# Patient Record
Sex: Male | Born: 1975 | Race: White | Hispanic: No | Marital: Married | Smoking: Current every day smoker
Health system: Southern US, Community
[De-identification: ages and names within clinical notes are randomized; demographics above are authoritative.]

## PROBLEM LIST (undated history)

## (undated) DIAGNOSIS — I38 Endocarditis, valve unspecified: Secondary | ICD-10-CM

## (undated) HISTORY — PX: HERNIA REPAIR: SHX51

---

## 2005-04-19 ENCOUNTER — Emergency Department: Payer: Self-pay | Admitting: Emergency Medicine

## 2007-01-25 ENCOUNTER — Emergency Department: Payer: Self-pay | Admitting: Emergency Medicine

## 2007-01-26 ENCOUNTER — Emergency Department: Payer: Self-pay | Admitting: Emergency Medicine

## 2007-05-23 ENCOUNTER — Emergency Department: Payer: Self-pay | Admitting: Emergency Medicine

## 2008-08-19 IMAGING — CR DG CHEST 2V
1 series · 2 of 2 positions shown · non-contrast
Comparison: none

REASON FOR EXAM: Cough
COMMENTS:   LMP: (Male)

PROCEDURE:     DXR - DXR CHEST PA (OR AP) AND LATERAL  - May 23, 2007  [DATE]
RESULT:     The lungs are clear. The cardiac silhouette and visualized bony
skeleton are unremarkable.

[Series 1: view not recorded · 0.17mm/px · 2 of 2 slices shown]
[im 1/2]
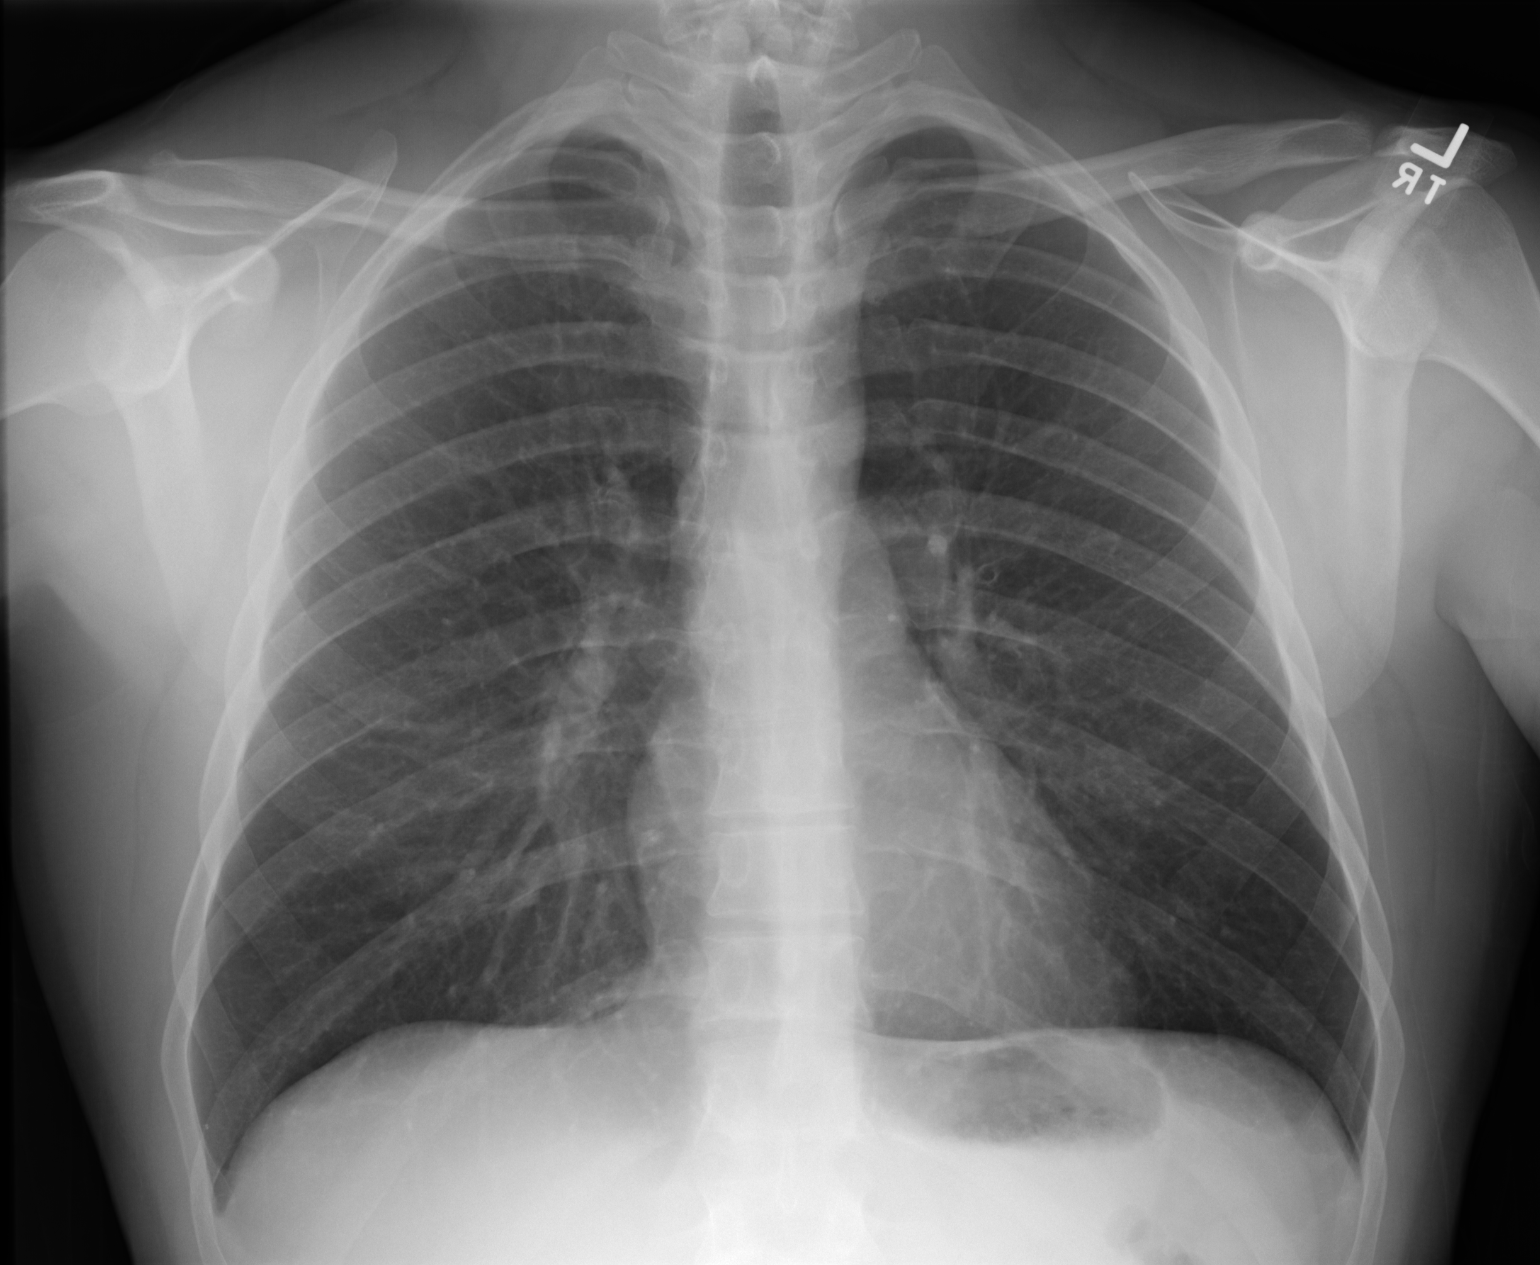
[im 2/2]
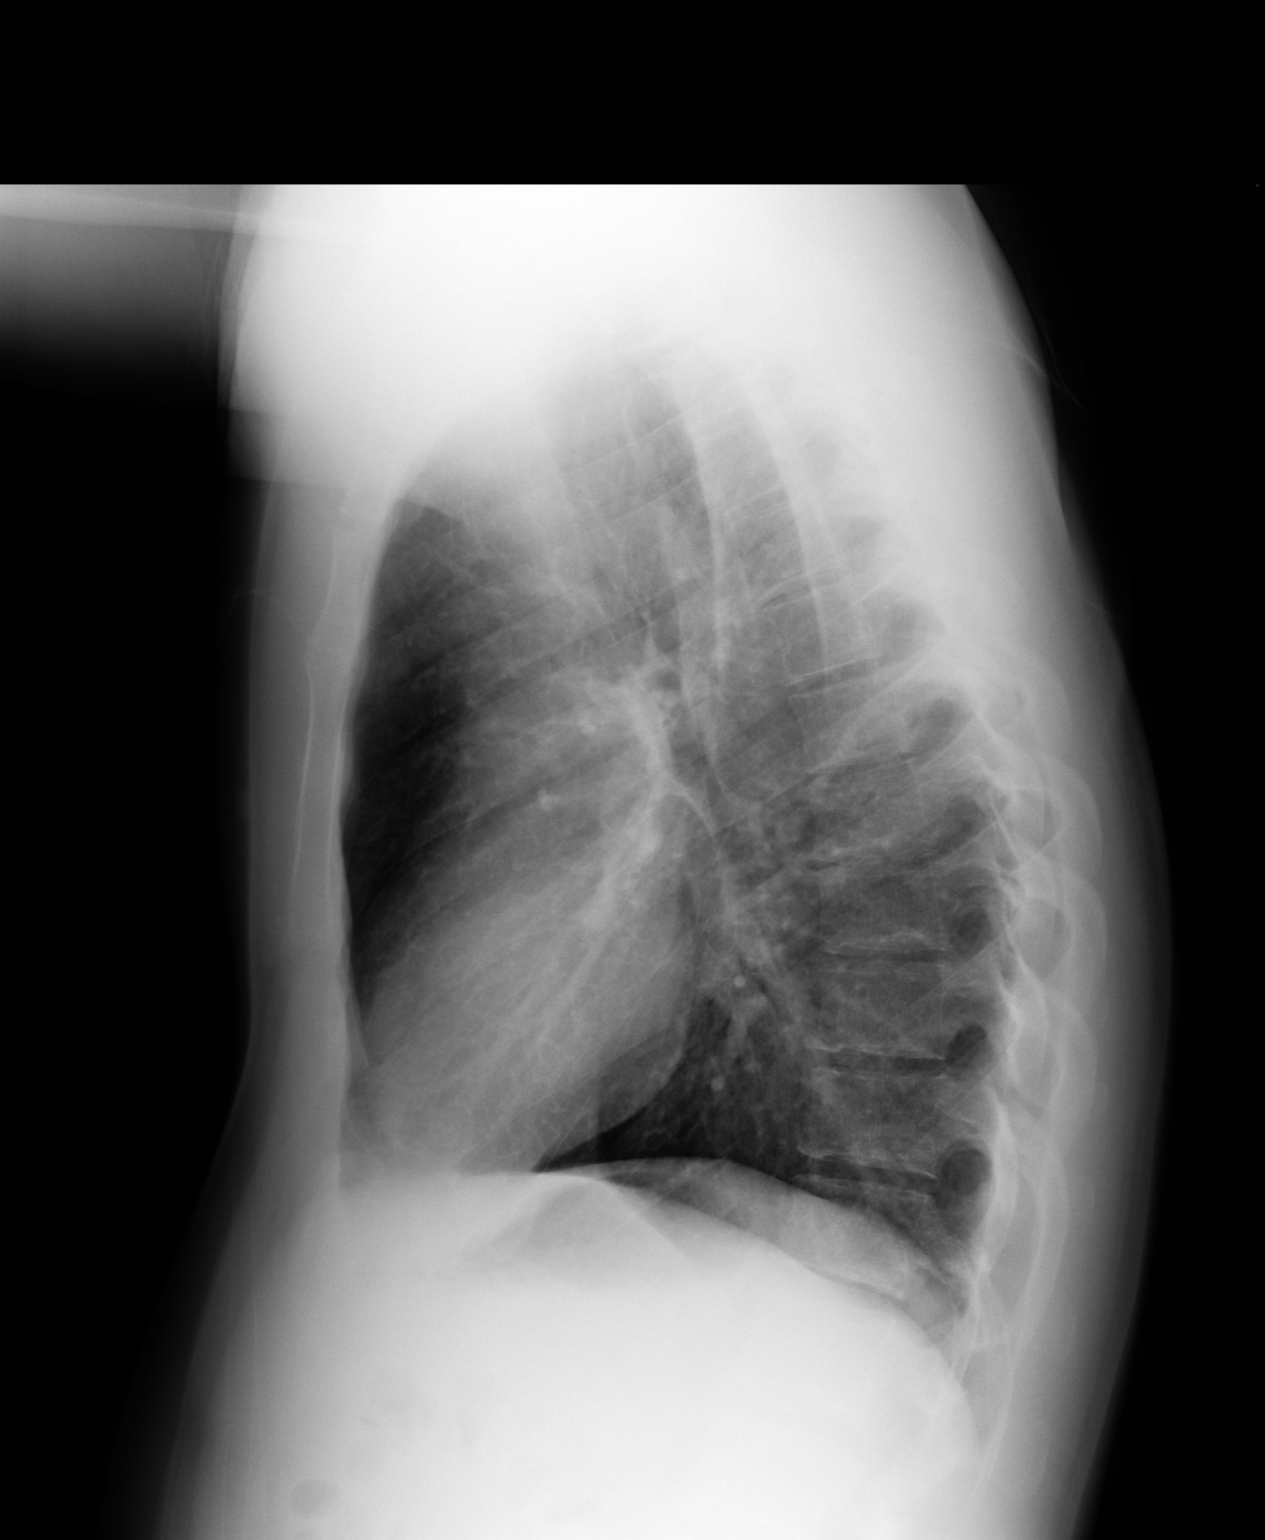

[2 of 2 positions shown; findings below may reference images not displayed]

IMPRESSION: Chest radiograph without evidence of acute cardiopulmonary
disease.

## 2008-12-30 ENCOUNTER — Emergency Department: Payer: Self-pay | Admitting: Emergency Medicine

## 2010-03-10 ENCOUNTER — Emergency Department: Payer: Self-pay | Admitting: Emergency Medicine

## 2012-02-24 ENCOUNTER — Ambulatory Visit: Payer: Self-pay

## 2012-07-11 ENCOUNTER — Emergency Department: Payer: Self-pay | Admitting: Emergency Medicine

## 2012-07-11 LAB — COMPREHENSIVE METABOLIC PANEL
Anion Gap: 4 — ABNORMAL LOW (ref 7–16)
BUN: 12 mg/dL (ref 7–18)
Bilirubin,Total: 0.7 mg/dL (ref 0.2–1.0)
Calcium, Total: 9 mg/dL (ref 8.5–10.1)
Chloride: 109 mmol/L — ABNORMAL HIGH (ref 98–107)
Co2: 26 mmol/L (ref 21–32)
Glucose: 98 mg/dL (ref 65–99)
Osmolality: 277 (ref 275–301)
Potassium: 4.1 mmol/L (ref 3.5–5.1)
SGOT(AST): 27 U/L (ref 15–37)
Total Protein: 7.7 g/dL (ref 6.4–8.2)

## 2012-07-11 LAB — LIPASE, BLOOD: Lipase: 66 U/L — ABNORMAL LOW (ref 73–393)

## 2012-07-11 LAB — CBC
HCT: 46.2 % (ref 40.0–52.0)
HGB: 15.7 g/dL (ref 13.0–18.0)
MCV: 85 fL (ref 80–100)
RDW: 12.8 % (ref 11.5–14.5)
WBC: 7 10*3/uL (ref 3.8–10.6)

## 2012-08-22 ENCOUNTER — Emergency Department: Payer: Self-pay | Admitting: Emergency Medicine

## 2015-10-01 ENCOUNTER — Emergency Department: Payer: Self-pay

## 2015-10-01 ENCOUNTER — Encounter: Payer: Self-pay | Admitting: Emergency Medicine

## 2015-10-01 DIAGNOSIS — F17200 Nicotine dependence, unspecified, uncomplicated: Secondary | ICD-10-CM | POA: Insufficient documentation

## 2015-10-01 DIAGNOSIS — R1013 Epigastric pain: Secondary | ICD-10-CM | POA: Insufficient documentation

## 2015-10-01 DIAGNOSIS — R109 Unspecified abdominal pain: Secondary | ICD-10-CM | POA: Insufficient documentation

## 2015-10-01 LAB — BASIC METABOLIC PANEL
ANION GAP: 7 (ref 5–15)
BUN: 10 mg/dL (ref 6–20)
CO2: 29 mmol/L (ref 22–32)
Calcium: 9.6 mg/dL (ref 8.9–10.3)
Chloride: 104 mmol/L (ref 101–111)
Creatinine, Ser: 0.9 mg/dL (ref 0.61–1.24)
GFR calc Af Amer: >60 mL/min (ref >60)
GFR calc non Af Amer: >60 mL/min (ref >60)
GLUCOSE: 88 mg/dL (ref 65–99)
POTASSIUM: 3.8 mmol/L (ref 3.5–5.1)
Sodium: 140 mmol/L (ref 135–145)

## 2015-10-01 LAB — CBC
HEMATOCRIT: 43 % (ref 40.0–52.0)
HEMOGLOBIN: 15 g/dL (ref 13.0–18.0)
MCH: 29.2 pg (ref 26.0–34.0)
MCHC: 34.8 g/dL (ref 32.0–36.0)
MCV: 84 fL (ref 80.0–100.0)
Platelets: 235 10*3/uL (ref 150–440)
RBC: 5.12 MIL/uL (ref 4.40–5.90)
RDW: 12.8 % (ref 11.5–14.5)
WBC: 10.9 10*3/uL — ABNORMAL HIGH (ref 3.8–10.6)

## 2015-10-01 LAB — TROPONIN I: Troponin I: <0.03 ng/mL (ref <0.031)

## 2015-10-01 NOTE — ED Notes (Signed)
PT to ER states chest pain for last 4-5 weeks intermittent.  States worse when driving or sitting up.  Pt points to just below epigastric region, states he feels a knot.  States got worse tonight when he picked up a water heater.  States mild SHOB, denies other symptoms.

## 2015-10-02 ENCOUNTER — Emergency Department
Admission: EM | Admit: 2015-10-02 | Discharge: 2015-10-02 | Disposition: A | Discharge status: Home / Self Care | Payer: Self-pay | Attending: Emergency Medicine | Admitting: Emergency Medicine

## 2015-10-02 ENCOUNTER — Emergency Department: Payer: Self-pay

## 2015-10-02 DIAGNOSIS — M7918 Myalgia, other site: Secondary | ICD-10-CM

## 2015-10-02 MED ORDER — MORPHINE SULFATE (PF) 2 MG/ML IV SOLN
2.0000 mg | Freq: Once | INTRAVENOUS | Status: DC
  Filled 2015-10-02: qty 1

## 2015-10-02 MED ORDER — CYCLOBENZAPRINE HCL 10 MG PO TABS: 10.0000 mg | ORAL_TABLET | Freq: Three times a day (TID) | ORAL | Status: AC | PRN

## 2015-10-02 MED ORDER — IOHEXOL 240 MG/ML SOLN
25.0000 mL | Freq: Once | INTRAMUSCULAR | Status: AC | PRN
  Administered 2015-10-02: 25 mL via ORAL

## 2015-10-02 MED ORDER — ONDANSETRON HCL 4 MG/2ML IJ SOLN
4.0000 mg | Freq: Once | INTRAMUSCULAR | Status: DC
  Filled 2015-10-02: qty 2

## 2015-10-02 MED ORDER — IOHEXOL 300 MG/ML  SOLN
100.0000 mL | Freq: Once | INTRAMUSCULAR | Status: AC | PRN
  Administered 2015-10-02: 100 mL via INTRAVENOUS

## 2015-10-02 NOTE — ED Provider Notes (Signed)
[facility] Emergency Department Provider Note  ____________________________________________  Time seen: 1:45 AM  I have reviewed the triage vital signs and the nursing notes.   HISTORY  Chief Complaint Chest Pain      HPI Danny Downs is a 39 y.o. male presents with epigastric abdominal discomfort 4-5 weeks with a "knot" in the area that is tender to touch. Patient states pain worsened tonight after he picked up a water heater.     Past medical history Umbilical Hernia There are no active problems to display for this patient.   Past Surgical History  Procedure Laterality Date  . Hernia repair      No current outpatient prescriptions on file.  Allergies No Known Drug allergies History reviewed. No pertinent family history.  Social History Social History  Substance Use Topics  . Smoking status: Heavy Tobacco Smoker  . Smokeless tobacco: None  . Alcohol Use: None    Review of Systems  Constitutional: Negative for fever. Eyes: Negative for visual changes. ENT: Negative for sore throat. Cardiovascular: Negative for chest pain. Respiratory: Negative for shortness of breath. Gastrointestinal: Positive for abdominal pain Genitourinary: Negative for dysuria. Musculoskeletal: Negative for back pain. Skin: Negative for rash. Neurological: Negative for headaches, focal weakness or numbness.   10-point ROS otherwise negative.  ____________________________________________   PHYSICAL EXAM:  VITAL SIGNS: ED Triage Vitals  Enc Vitals Group     BP 10/01/15 2038 119/68 mmHg     Pulse Rate 10/01/15 2038 71     Resp 10/01/15 2038 18     Temp 10/01/15 2038 98.1 F (36.7 C)     Temp src --      SpO2 10/01/15 2038 97 %     Weight 10/01/15 2038 137 lb (62.143 kg)     Height 10/01/15 2038 5\' 4"  (1.626 m)     Head Cir --      Peak Flow --      Pain Score 10/01/15 2037 5     Pain Loc --      Pain Edu? --      Excl. in GC? --      Constitutional: Alert and oriented. Well appearing and in no distress. Eyes: Conjunctivae are normal. PERRL. Normal extraocular movements. ENT   Head: Normocephalic and atraumatic.   Nose: No congestion/rhinnorhea.   Mouth/Throat: Mucous membranes are moist.   Neck: No stridor. Hematological/Lymphatic/Immunilogical: No cervical lymphadenopathy. Cardiovascular: Normal rate, regular rhythm. Normal and symmetric distal pulses are present in all extremities. No murmurs, rubs, or gallops. Respiratory: Normal respiratory effort without tachypnea nor retractions. Breath sounds are clear and equal bilaterally. No wheezes/rales/rhonchi. Gastrointestinal: Palpable epigastric 2 cm masslike area is tender to touch. No distention. There is no CVA tenderness. Genitourinary: deferred Musculoskeletal: Nontender with normal range of motion in all extremities. No joint effusions.  No lower extremity tenderness nor edema. Neurologic:  Normal speech and language. No gross focal neurologic deficits are appreciated. Speech is normal.  Skin:  Skin is warm, dry and intact. No rash noted. Psychiatric: Mood and affect are normal. Speech and behavior are normal. Patient exhibits appropriate insight and judgment.  ____________________________________________    LABS (pertinent positives/negatives)  Labs Reviewed  CBC - Abnormal; Notable for the following:    WBC 10.9 (*)    All other components within normal limits  BASIC METABOLIC PANEL  TROPONIN I      RADIOLOGY    DG Chest 2 View (Final result) Result time: 10/01/15 21:48:13  Final result by Rad Results In Interface (10/01/15 21:48:13)   Narrative:   CLINICAL DATA: Chest pain and shortness of breath for several weeks.  EXAM: CHEST 2 VIEW  COMPARISON: 12/30/2008  FINDINGS: The heart size and mediastinal contours are within normal limits. No evidence of pulmonary airspace disease or pleural effusion. Mild hyperinflation  and central peribronchial thickening again noted. The visualized skeletal structures are unremarkable.  IMPRESSION: Stable mild hyperinflation and central peribronchial thickening. No active lung disease.   Electronically Signed By: Myles Rosenthal M.D. On: 10/01/2015 21:48      CT Abdomen Pelvis W Contrast (Final result) Result time: 10/02/15 02:56:08   Final result by Rad Results In Interface (10/02/15 02:56:08)   Narrative:   CLINICAL DATA: Epigastric discomfort. Non reducible palpable mass.  EXAM: CT ABDOMEN AND PELVIS WITH CONTRAST  TECHNIQUE: Multidetector CT imaging of the abdomen and pelvis was performed using the standard protocol following bolus administration of intravenous contrast.  CONTRAST: OMNIPAQUE IOHEXOL 300 MG/ML SOLN  COMPARISON: None.  FINDINGS: Lower chest: The included lung bases are clear.  Liver: No focal hepatic lesion. Normal in size.  Hepatobiliary: Gallbladder physiologically distended, no calcified stone. No biliary dilatation.  Pancreas: Normal. No ductal dilatation, inflammation or pancreatic mass.  Spleen: Normal.  Adrenal glands: No nodule.  Kidneys: Symmetric renal enhancement. No hydronephrosis. No perinephric stranding or localizing renal abnormality.  Stomach/Bowel: Stomach physiologically distended. There are no dilated or thickened small bowel loops. Moderate volume of stool throughout the colon without colonic wall thickening. The appendix is normal.  Vascular/Lymphatic: No retroperitoneal adenopathy. Abdominal aorta is normal in caliber. Mild atherosclerosis of the distal aorta and its branches.  Reproductive: Prostate gland normal in size.  Bladder: Physiologically distended without wall thickening.  Other: Anterior abdominal wall is intact without hernia. No subcutaneous soft tissue abnormality. No intra-abdominal mass. No free air, free fluid, or intra-abdominal fluid  collection.  Musculoskeletal: There are no acute or suspicious osseous abnormalities.  IMPRESSION: No acute abnormality in the abdomen/pelvis. No mass or hernia.   Electronically Signed By: Rubye Oaks M.D. On: 10/02/2015 02:56         INITIAL IMPRESSION / ASSESSMENT AND PLAN / ED COURSE  Pertinent labs & imaging results that were available during my care of the patient were reviewed by me and considered in my medical decision making (see chart for details).    ____________________________________________   FINAL CLINICAL IMPRESSION(S) / ED DIAGNOSES  Final diagnoses:  Musculoskeletal pain      Darci Current, MD 10/03/15 [phone]

## 2015-10-02 NOTE — Discharge Instructions (Signed)
Muscle Pain, Adult  Muscle pain (myalgia) may be caused by many things, including:  · Overuse or muscle strain, especially if you are not in shape. This is the most common cause of muscle pain.  · Injury.  · Bruises.  · Viruses, such as the flu.  · Infectious diseases.  · Fibromyalgia, which is a chronic condition that causes muscle tenderness, fatigue, and headache.  · Autoimmune diseases, including lupus.  · Certain drugs, including ACE inhibitors and statins.  Muscle pain may be mild or severe. In most cases, the pain lasts only a short time and goes away without treatment. To diagnose the cause of your muscle pain, your health care provider will take your medical history. This means he or she will ask you when your muscle pain began and what has been happening. If you have not had muscle pain for very long, your health care provider may want to wait before doing much testing. If your muscle pain has lasted a long time, your health care provider may want to run tests right away. If your health care provider thinks your muscle pain may be caused by illness, you may need to have additional tests to rule out certain conditions.   Treatment for muscle pain depends on the cause. Home care is often enough to relieve muscle pain. Your health care provider may also prescribe anti-inflammatory medicine.  HOME CARE INSTRUCTIONS  Watch your condition for any changes. The following actions may help to lessen any discomfort you are feeling:  · Only take over-the-counter or prescription medicines as directed by your health care provider.  · Apply ice to the sore muscle:    Put ice in a plastic bag.    Place a towel between your skin and the bag.    Leave the ice on for 15-20 minutes, 3-4 times a day.  · You may alternate applying hot and cold packs to the muscle as directed by your health care provider.  · If overuse is causing your muscle pain, slow down your activities until the pain goes away.    Remember that it is normal  to feel some muscle pain after starting a workout program. Muscles that have not been used often will be sore at first.    Do regular, gentle exercises if you are not usually active.    Warm up before exercising to lower your risk of muscle pain.  · Do not continue working out if the pain is very bad. Bad pain could mean you have injured a muscle.  SEEK MEDICAL CARE IF:  · Your muscle pain gets worse, and medicines do not help.  · You have muscle pain that lasts longer than 3 days.  · You have a rash or fever along with muscle pain.  · You have muscle pain after a tick bite.  · You have muscle pain while working out, even though you are in good physical condition.  · You have redness, soreness, or swelling along with muscle pain.  · You have muscle pain after starting a new medicine or changing the dose of a medicine.  SEEK IMMEDIATE MEDICAL CARE IF:  · You have trouble breathing.  · You have trouble swallowing.  · You have muscle pain along with a stiff neck, fever, and vomiting.  · You have severe muscle weakness or cannot move part of your body.  MAKE SURE YOU:   · Understand these instructions.  · Will watch your condition.  · Will get   help right away if you are not doing well or get worse.     This information is not intended to replace advice given to you by your health care provider. Make sure you discuss any questions you have with your health care provider.     Document Released: 09/23/2006 Document Revised: 11/22/2014 Document Reviewed: 08/28/2013  Elsevier Interactive Patient Education ©2016 [employer]

## 2016-10-25 ENCOUNTER — Encounter: Payer: Self-pay | Admitting: Emergency Medicine

## 2016-10-25 ENCOUNTER — Emergency Department: Payer: Self-pay

## 2016-10-25 ENCOUNTER — Emergency Department
Admission: EM | Admit: 2016-10-25 | Discharge: 2016-10-25 | Disposition: A | Discharge status: Home / Self Care | Payer: Self-pay | Attending: Emergency Medicine | Admitting: Emergency Medicine

## 2016-10-25 DIAGNOSIS — F410 Panic disorder [episodic paroxysmal anxiety] without agoraphobia: Secondary | ICD-10-CM | POA: Insufficient documentation

## 2016-10-25 DIAGNOSIS — F17200 Nicotine dependence, unspecified, uncomplicated: Secondary | ICD-10-CM | POA: Insufficient documentation

## 2016-10-25 DIAGNOSIS — R079 Chest pain, unspecified: Secondary | ICD-10-CM

## 2016-10-25 HISTORY — DX: Endocarditis, valve unspecified: I38

## 2016-10-25 LAB — CBC
HCT: 42.1 % (ref 40.0–52.0)
Hemoglobin: 14.6 g/dL (ref 13.0–18.0)
MCH: 29.1 pg (ref 26.0–34.0)
MCHC: 34.7 g/dL (ref 32.0–36.0)
MCV: 83.7 fL (ref 80.0–100.0)
PLATELETS: 279 10*3/uL (ref 150–440)
RBC: 5.02 MIL/uL (ref 4.40–5.90)
RDW: 13 % (ref 11.5–14.5)
WBC: 9.4 10*3/uL (ref 3.8–10.6)

## 2016-10-25 LAB — BASIC METABOLIC PANEL
ANION GAP: 5 (ref 5–15)
BUN: 10 mg/dL (ref 6–20)
CALCIUM: 9.5 mg/dL (ref 8.9–10.3)
CHLORIDE: 107 mmol/L (ref 101–111)
CO2: 27 mmol/L (ref 22–32)
CREATININE: 0.73 mg/dL (ref 0.61–1.24)
GFR calc Af Amer: >60 mL/min (ref >60)
GFR calc non Af Amer: >60 mL/min (ref >60)
GLUCOSE: 103 mg/dL — AB (ref 65–99)
POTASSIUM: 3.8 mmol/L (ref 3.5–5.1)
SODIUM: 139 mmol/L (ref 135–145)

## 2016-10-25 LAB — TROPONIN I

## 2016-10-25 MED ORDER — ESCITALOPRAM OXALATE 10 MG PO TABS: 10.0000 mg | ORAL_TABLET | Freq: Every day | ORAL | 2 refills | Status: DC
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
## 2016-10-25 NOTE — ED Triage Notes (Signed)
Says chest pain since this am.  Thought it was regular panic attack, but did not get better and his chest hurts.

## 2016-12-28 IMAGING — CR DG CHEST 2V
2 series · 2 of 2 positions shown · non-contrast
Comparison: 12/30/2008

CLINICAL DATA: Chest pain and shortness of breath for several
weeks.

EXAM:
CHEST  2 VIEW

[chest pa]
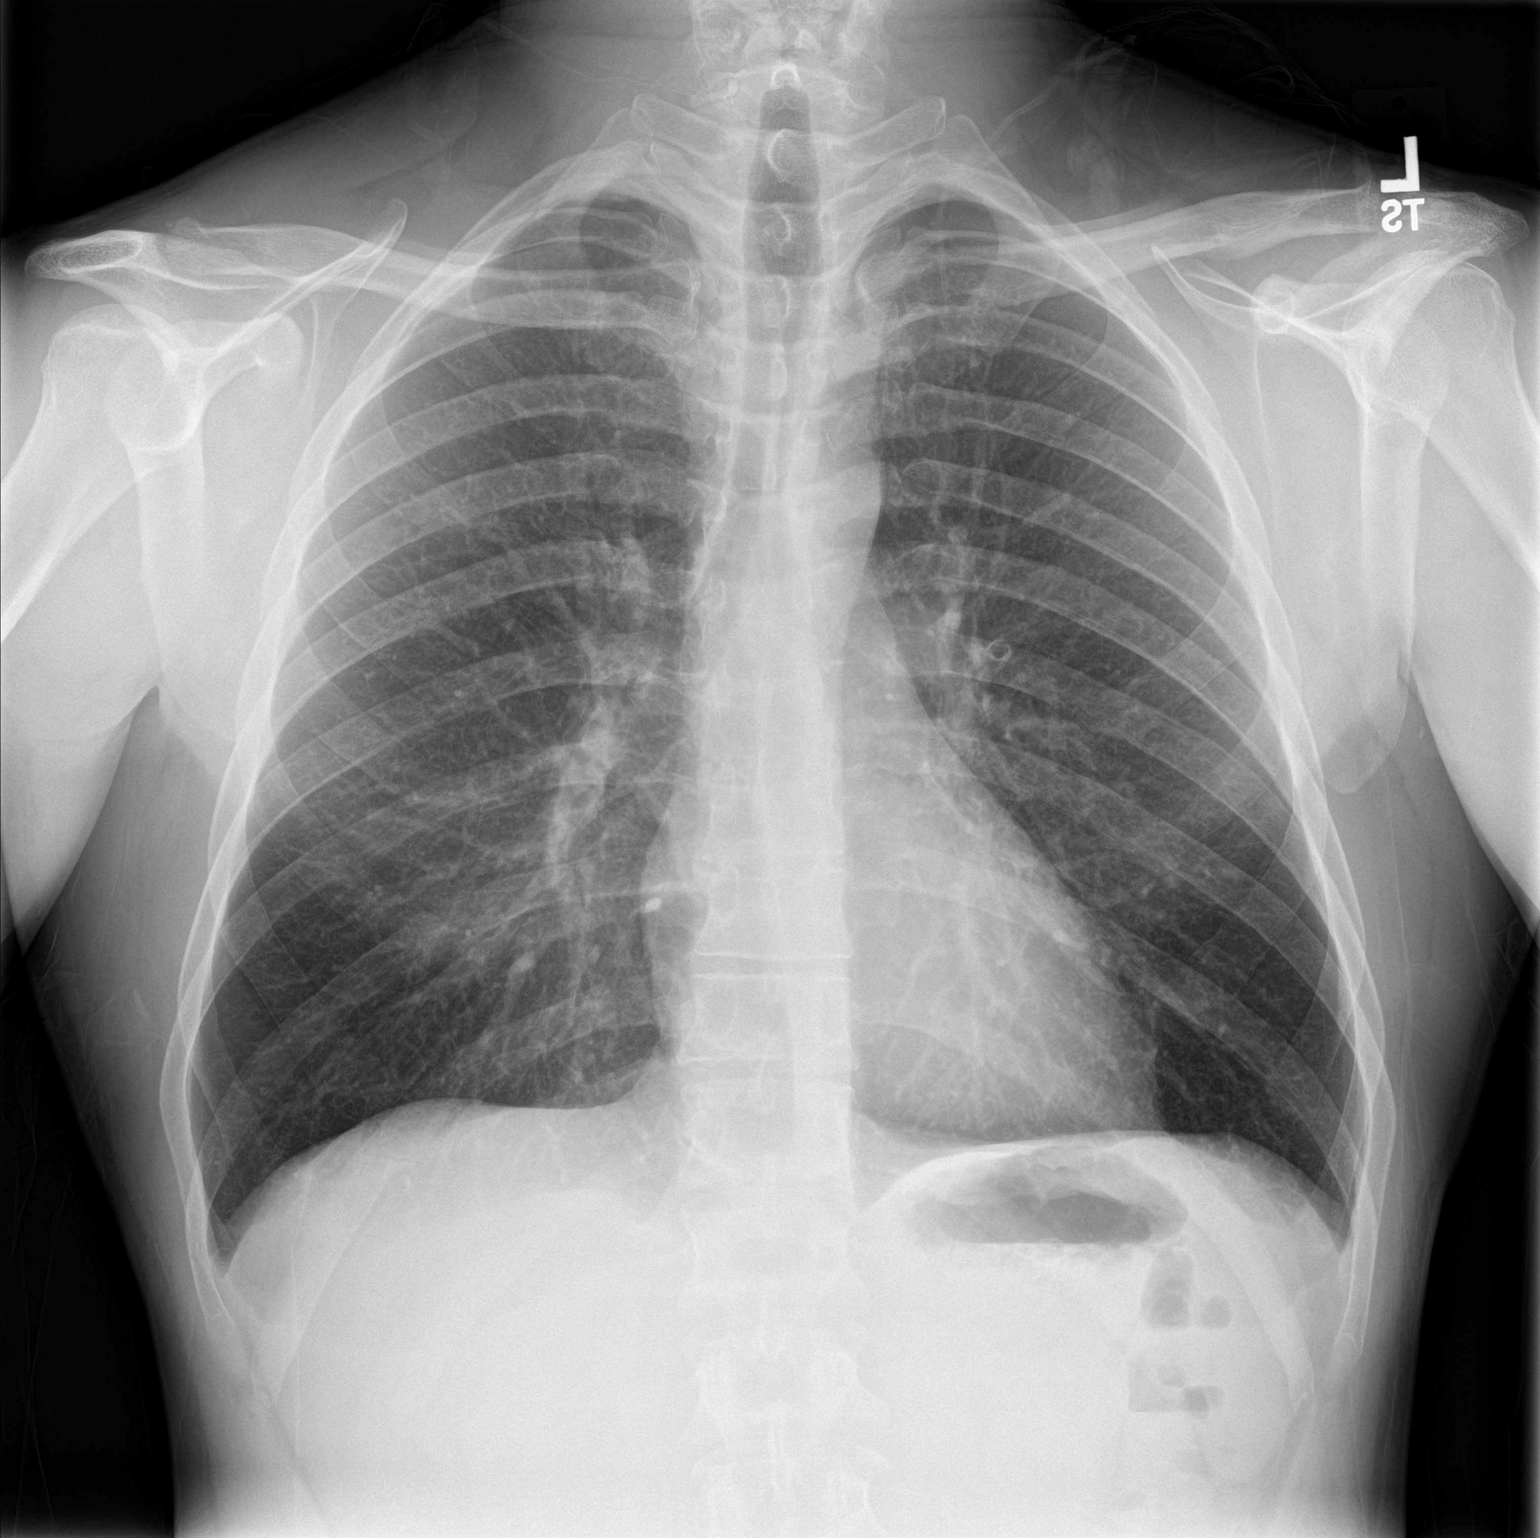

[chest lat]
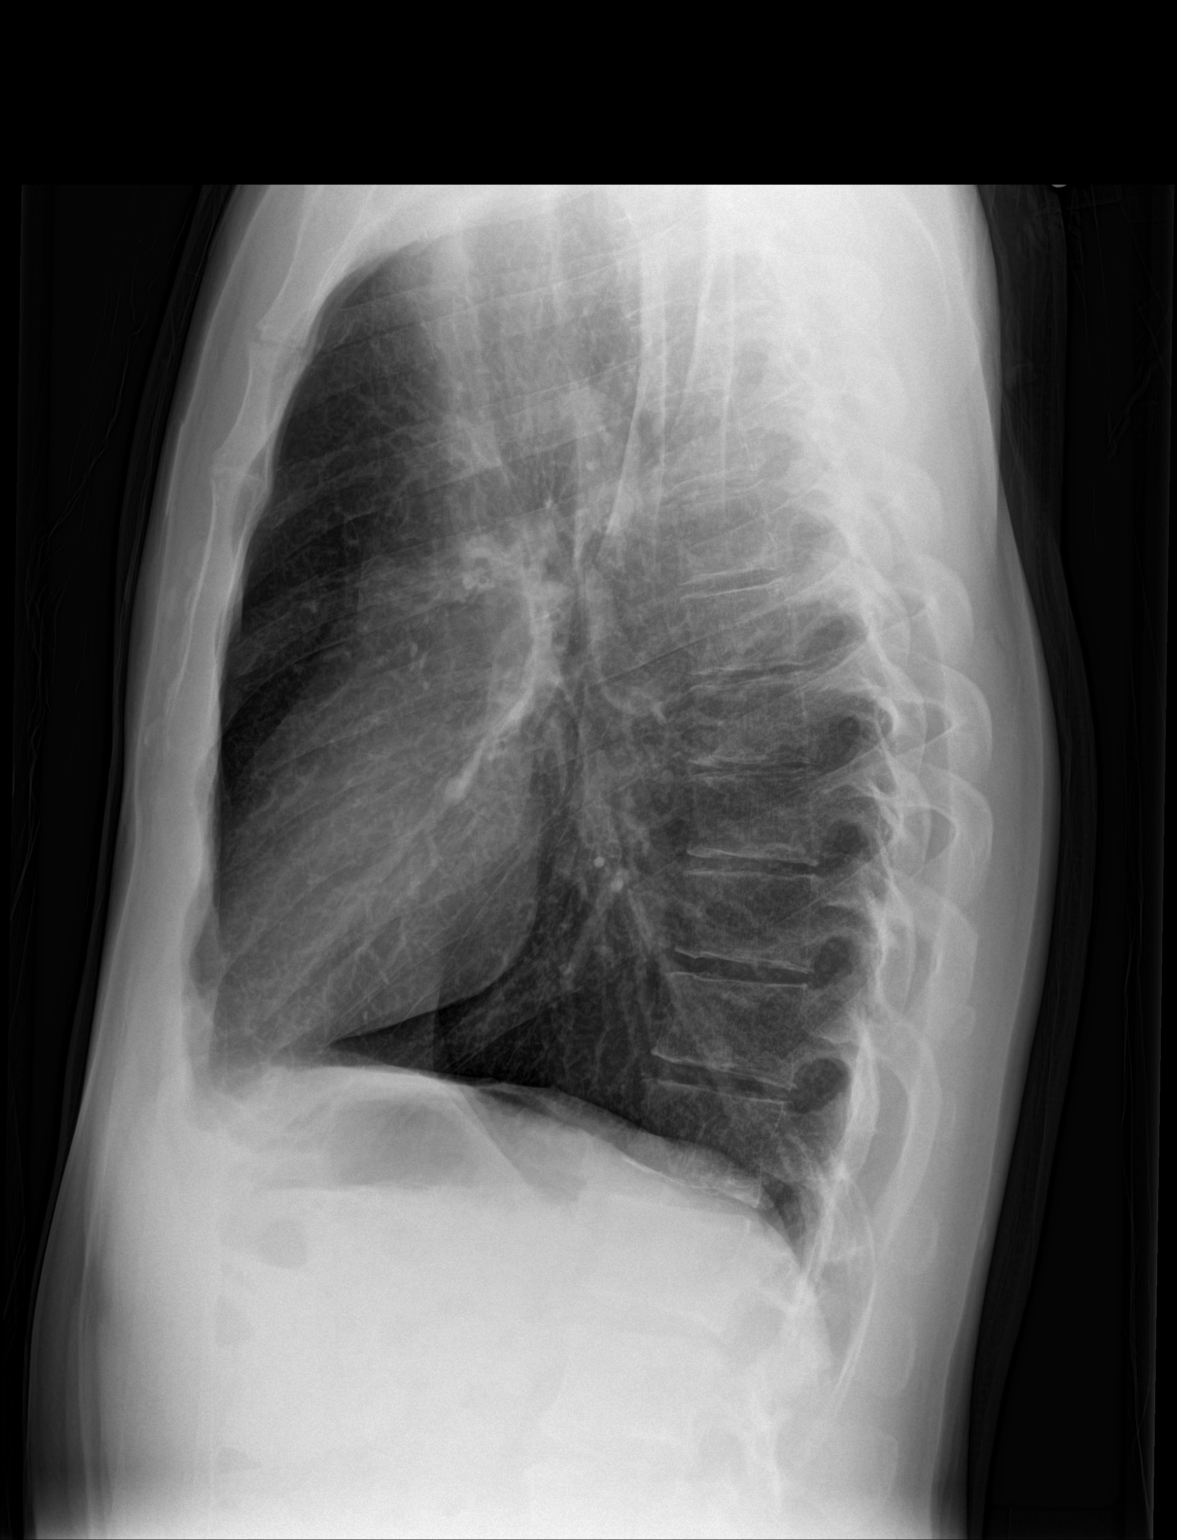

[2 of 2 positions shown; findings below may reference images not displayed]

FINDINGS: The heart size and mediastinal contours are within normal limits. No
evidence of pulmonary airspace disease or pleural effusion. Mild
hyperinflation and central peribronchial thickening again noted. The
visualized skeletal structures are unremarkable.
IMPRESSION: Stable mild hyperinflation and central peribronchial thickening. No
active lung disease.

## 2018-01-10 ENCOUNTER — Other Ambulatory Visit: Payer: Self-pay

## 2018-01-10 ENCOUNTER — Encounter: Payer: Self-pay | Admitting: Intensive Care

## 2018-01-10 ENCOUNTER — Emergency Department: Payer: BLUE CROSS/BLUE SHIELD

## 2018-01-10 ENCOUNTER — Ambulatory Visit: Payer: Self-pay | Admitting: *Deleted

## 2018-01-10 ENCOUNTER — Emergency Department
Admission: EM | Admit: 2018-01-10 | Discharge: 2018-01-10 | Disposition: A | Discharge status: Home / Self Care | Payer: BLUE CROSS/BLUE SHIELD | Attending: Emergency Medicine | Admitting: Emergency Medicine

## 2018-01-10 DIAGNOSIS — Z79899 Other long term (current) drug therapy: Secondary | ICD-10-CM | POA: Diagnosis not present

## 2018-01-10 DIAGNOSIS — R002 Palpitations: Secondary | ICD-10-CM | POA: Insufficient documentation

## 2018-01-10 DIAGNOSIS — F17290 Nicotine dependence, other tobacco product, uncomplicated: Secondary | ICD-10-CM | POA: Diagnosis not present

## 2018-01-10 DIAGNOSIS — R0789 Other chest pain: Secondary | ICD-10-CM | POA: Diagnosis present

## 2018-01-10 LAB — CBC
HCT: 44 % (ref 40.0–52.0)
Hemoglobin: 15 g/dL (ref 13.0–18.0)
MCH: 29.1 pg (ref 26.0–34.0)
MCHC: 34 g/dL (ref 32.0–36.0)
MCV: 85.6 fL (ref 80.0–100.0)
Platelets: 297 K/uL (ref 150–440)
RBC: 5.15 MIL/uL (ref 4.40–5.90)
RDW: 12.9 % (ref 11.5–14.5)
WBC: 10.7 K/uL — ABNORMAL HIGH (ref 3.8–10.6)

## 2018-01-10 LAB — BASIC METABOLIC PANEL WITH GFR
Anion gap: 8 (ref 5–15)
BUN: 11 mg/dL (ref 6–20)
CO2: 28 mmol/L (ref 22–32)
Calcium: 9.6 mg/dL (ref 8.9–10.3)
Chloride: 102 mmol/L (ref 101–111)
Creatinine, Ser: 0.66 mg/dL (ref 0.61–1.24)
GFR calc Af Amer: >60 mL/min
GFR calc non Af Amer: >60 mL/min
Glucose, Bld: 93 mg/dL (ref 65–99)
Potassium: 3.9 mmol/L (ref 3.5–5.1)
Sodium: 138 mmol/L (ref 135–145)

## 2018-01-10 LAB — TROPONIN I: Troponin I: <0.03 ng/mL

## 2018-01-10 LAB — FIBRIN DERIVATIVES D-DIMER (ARMC ONLY): Fibrin derivatives D-dimer (ARMC): 232.59 ng{FEU}/mL (ref 0.00–499.00)

## 2018-01-10 NOTE — ED Notes (Signed)
When checking lab work, no results noted. Lab notified, stated they could not find pts blood.  [facility] and [place], [facility] both state pts blood was sent to lab. Blood redrawn and sent. Pt tolerated well.

## 2018-01-10 NOTE — Discharge Instructions (Signed)
Return to the ER for new, worsening, or persistent palpitations, chest pain, difficulty breathing, weakness or lightheadedness, or any other new or worsening symptoms that concern you.  We have given you a referral to a cardiologist in the area and he should follow-up within the next week if possible.

## 2018-01-10 NOTE — ED Triage Notes (Signed)
Patient c/o stabbing Left sided chest pain for 2 weeks with radiation to L arm and neck. A&O x4 Ambulatory with no problems

## 2018-01-10 NOTE — ED Provider Notes (Signed)
[facility] Emergency Department Provider Note ____________________________________________   First MD Initiated Contact with Patient 01/10/18 1835     (approximate)  I have reviewed the triage vital signs and the nursing notes.   HISTORY  Chief Complaint Chest Pain    HPI Danny Downs is a 42 y.o. male with past medical history as noted below who presents with palpitations, described as a feeling like his heart is momentarily stopping, occurring over approximately the last 2 weeks, and worst yesterday.  The patient states that yesterday it was happening multiple times now, and he had some associated lightheadedness.  He states that today it has significantly decreased, and currently is not present all.  He also reports mild anterior substernal dull chest pain over the same time period, associated with some shortness of breath.  He denies cough or fever.  No leg pain or swelling.   Past Medical History:  Diagnosis Date  . Heart valve disorder     There are no active problems to display for this patient.   Past Surgical History:  Procedure Laterality Date  . HERNIA REPAIR      Prior to Admission medications   Medication Sig Start Date End Date Taking? Authorizing Provider  dextromethorphan-guaiFENesin (MUCINEX DM) 30-600 MG 12hr tablet Take 1 tablet by mouth 2 (two) times daily.    [provider]  escitalopram (LEXAPRO) 10 MG tablet Take 1 tablet (10 mg total) by mouth daily. 10/25/16 10/25/17  Emily FilbertWilliams, Jonathan E, MD  Pseudoeph-Doxylamine-DM-APAP (NYQUIL PO) Take by mouth.    [provider]    Allergies Patient has no known allergies.  History reviewed. No pertinent family history.  Social History Social History   Tobacco Use  . Smoking status: Current Every Day Smoker    Types: E-cigarettes    Last attempt to quit: 11/15/2017    Years since quitting: 0.1  . Smokeless tobacco: Never Used  . Tobacco comment: Quit  cigs Nov 15, 2017  Substance Use Topics  . Alcohol use: Yes    Comment: weekends  . Drug use: Yes    Types: Marijuana    Review of Systems  Constitutional: No fever/chills Eyes: No redness. ENT: No sore throat. Cardiovascular: Positive for chest pain. Respiratory: Positive for shortness of breath. Gastrointestinal: No nausea, no vomiting.  No diarrhea.  Genitourinary: Negative for dysuria.  Musculoskeletal: Negative for back pain. Skin: Negative for rash. Neurological: Negative for headache.   ____________________________________________   PHYSICAL EXAM:  VITAL SIGNS: ED Triage Vitals  Enc Vitals Group     BP 01/10/18 1445 121/73     Pulse Rate 01/10/18 1445 62     Resp 01/10/18 1445 16     Temp 01/10/18 1445 98.2 F (36.8 C)     Temp Source 01/10/18 1445 Oral     SpO2 01/10/18 1445 97 %     Weight 01/10/18 1446 147 lb (66.7 kg)     Height 01/10/18 1446 5\' 4"  (1.626 m)     Head Circumference --      Peak Flow --      Pain Score 01/10/18 1504 4     Pain Loc --      Pain Edu? --      Excl. in GC? --     Constitutional: Alert and oriented. Well appearing and in no acute distress. Eyes: Conjunctivae are normal.  Head: Atraumatic. Nose: No congestion/rhinnorhea. Mouth/Throat: Mucous membranes are moist.   Neck: Normal range of motion.  Cardiovascular: Normal rate, regular rhythm. Grossly normal heart sounds.  Good peripheral circulation. Respiratory: Normal respiratory effort.  No retractions. Lungs CTAB. Gastrointestinal: Soft and nontender. No distention.  Genitourinary: No CVA tenderness. Musculoskeletal: No lower extremity edema.  Extremities warm and well perfused.  No calf or popliteal swelling or tenderness. Neurologic:  Normal speech and language. No gross focal neurologic deficits are appreciated.  Skin:  Skin is warm and dry. No rash noted. Psychiatric: Mood and affect are normal. Speech and behavior are  normal.  ____________________________________________   LABS (all labs ordered are listed, but only abnormal results are displayed)  Labs Reviewed  CBC - Abnormal; Notable for the following components:      Result Value   WBC 10.7 (*)    All other components within normal limits  BASIC METABOLIC PANEL  TROPONIN I  FIBRIN DERIVATIVES D-DIMER ([facility] ONLY)   ____________________________________________  EKG  ED ECG REPORT I, Dionne Bucy, the attending physician, personally viewed and interpreted this ECG.  Date: 01/10/2018 EKG Time: 1441 Rate: 67 Rhythm: normal sinus rhythm QRS Axis: normal Intervals: normal ST/T Wave abnormalities: normal Narrative Interpretation: no evidence of acute ischemia  ____________________________________________  RADIOLOGY  CXR: No focal infiltrate or other acute findings  ____________________________________________   PROCEDURES  Procedure(s) performed: No  Procedures  Critical Care performed: No ____________________________________________   INITIAL IMPRESSION / ASSESSMENT AND PLAN / ED COURSE  Pertinent labs & imaging results that were available during my care of the patient were reviewed by me and considered in my medical decision making (see chart for details).  42 year old male with no significant past medical history presents with palpitations over approximately last 2 weeks, worsening yesterday, but now mostly resolved today and associated with some atypical and nonexertional chest pain and intermittent mild difficulty breathing.  Past medical records reviewed in Epic; patient was seen in 2017 for chest pain and discharge.  No other recent ED or admissions.  On exam, the patient is extremely well-appearing, vital signs are normal, and the remainder the exam is unremarkable.  EKG is also normal.  Differential for the palpitations includes most likely PVCs, versus less likely some other type of paroxysmal arrhythmia.   There is no clinical evidence to support ACS, and I have a low suspicion for PE.  Given that the patient does have some chest pain and shortness of breath subjectively, will obtain a d-dimer to rule out.  Initial basic labs and troponin are negative, and given the duration of the pain and the patient is low risk for ACS there is no indication for repeat troponin.  Anticipate discharge home if negative d-dimer, with cardiology follow-up for possible Holter monitor.  ----------------------------------------- 7:44 PM on 01/10/2018 -----------------------------------------  D-dimer is negative.  Patient continues to remain asymptomatic, and well-appearing.  He feels well to go home.  Will provide cardiology referral for further outpatient workup if his symptoms continue.  Thorough return precautions given, and the patient expresses understanding.      ____________________________________________   FINAL CLINICAL IMPRESSION(S) / ED DIAGNOSES  Final diagnoses:  Palpitations      NEW MEDICATIONS STARTED DURING THIS VISIT:  New Prescriptions   No medications on file     Note:  This document was prepared using Dragon voice recognition software and may include unintentional dictation errors.    Dionne Bucy, MD 01/10/18 1945

## 2018-01-10 NOTE — ED Notes (Signed)
First Nurse Note:  Patient complaining of left sided chest pain up into axilla X 2.5 weeks.  Here today because he is scared.  Also has nausea and "clammy".  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

## 2018-01-10 NOTE — Telephone Encounter (Signed)
Pt reports "palpitations" last 3 weeks, worsening last several days, now with CP. Pain left chest, radiates down left arm, left fingers tingling. Also reports SOB,nausea, dizziness (room spinning) with CP episodes. Occurs several times a day, not presently. HR checked during call, "68".  Reports pulse "is skipping." Denies CP, SOB, dizziness at present. "Just don't feel right." H/O anxiety; states is off all medications. Last saw Dr. Dossie Arbourrissman "many years ago." Would like to re-establish with Dr. Dossie Arbourrissman. Directed pt to ED. Pt states wife can drive.    Reason for Disposition . [1] Skipped or extra beat(s) AND [2] occurs 4 or more times per minute  Answer Assessment - Initial Assessment Questions 1. DESCRIPTION: "Please describe your heart rate or heart beat that you are having" (e.g., fast/slow, regular/irregular, skipped or extra beats, "palpitations")     Palpitations, "pretty much all the time". 2. ONSET: "When did it start?" (Minutes, hours or days)      3 weeks ago 3. DURATION: "How long does it last" (e.g., seconds, minutes, hours)    " Seems constant, fluttering, skipped beats." 4. PATTERN "Does it come and go, or has it been constant since it started?"  "Does it get worse with exertion?"   "Are you feeling it now?"     Worse with exertion 5. TAP: "Using your hand, can you tap out what you are feeling on a chair or table in front of you, so that I can hear?" (Note: not all patients can do this)        6. HEART RATE: "Can you tell me your heart rate?" "How many beats in 15 seconds?"  (Note: not all patients can do this)       68, "feels like skipping beats."  7. RECURRENT SYMPTOM: "Have you ever had this before?" If so, ask: "When was the last time?" and "What happened that time?"      No. I've had anxiety 8. CAUSE: "What do you think is causing the palpitations?"     Unsure 9. CARDIAC HISTORY: "Do you have any history of heart disease?" (e.g., heart attack, angina, bypass surgery,  angioplasty, arrhythmia)      no 10. OTHER SYMPTOMS: "Do you have any other symptoms?" (e.g., dizziness, chest pain, sweating, difficulty breathing)       Chest pain, radiates down left arm, left fingers tingly, comes and go, SOB, dizziness with CP episodes, "Room spinning."  Protocols used: HEART RATE AND HEARTBEAT QUESTIONS-A-AH

## 2018-01-22 IMAGING — CR DG CHEST 2V
1 series · 2 of 2 positions shown · non-contrast
Comparison: October 01, 2015

CLINICAL DATA: Chest pain for 3 weeks

EXAM:
CHEST  2 VIEW

[Series 1: dg chest 2 view · 0.14mm/px · 2 of 2 slices shown]
[im 1/2]
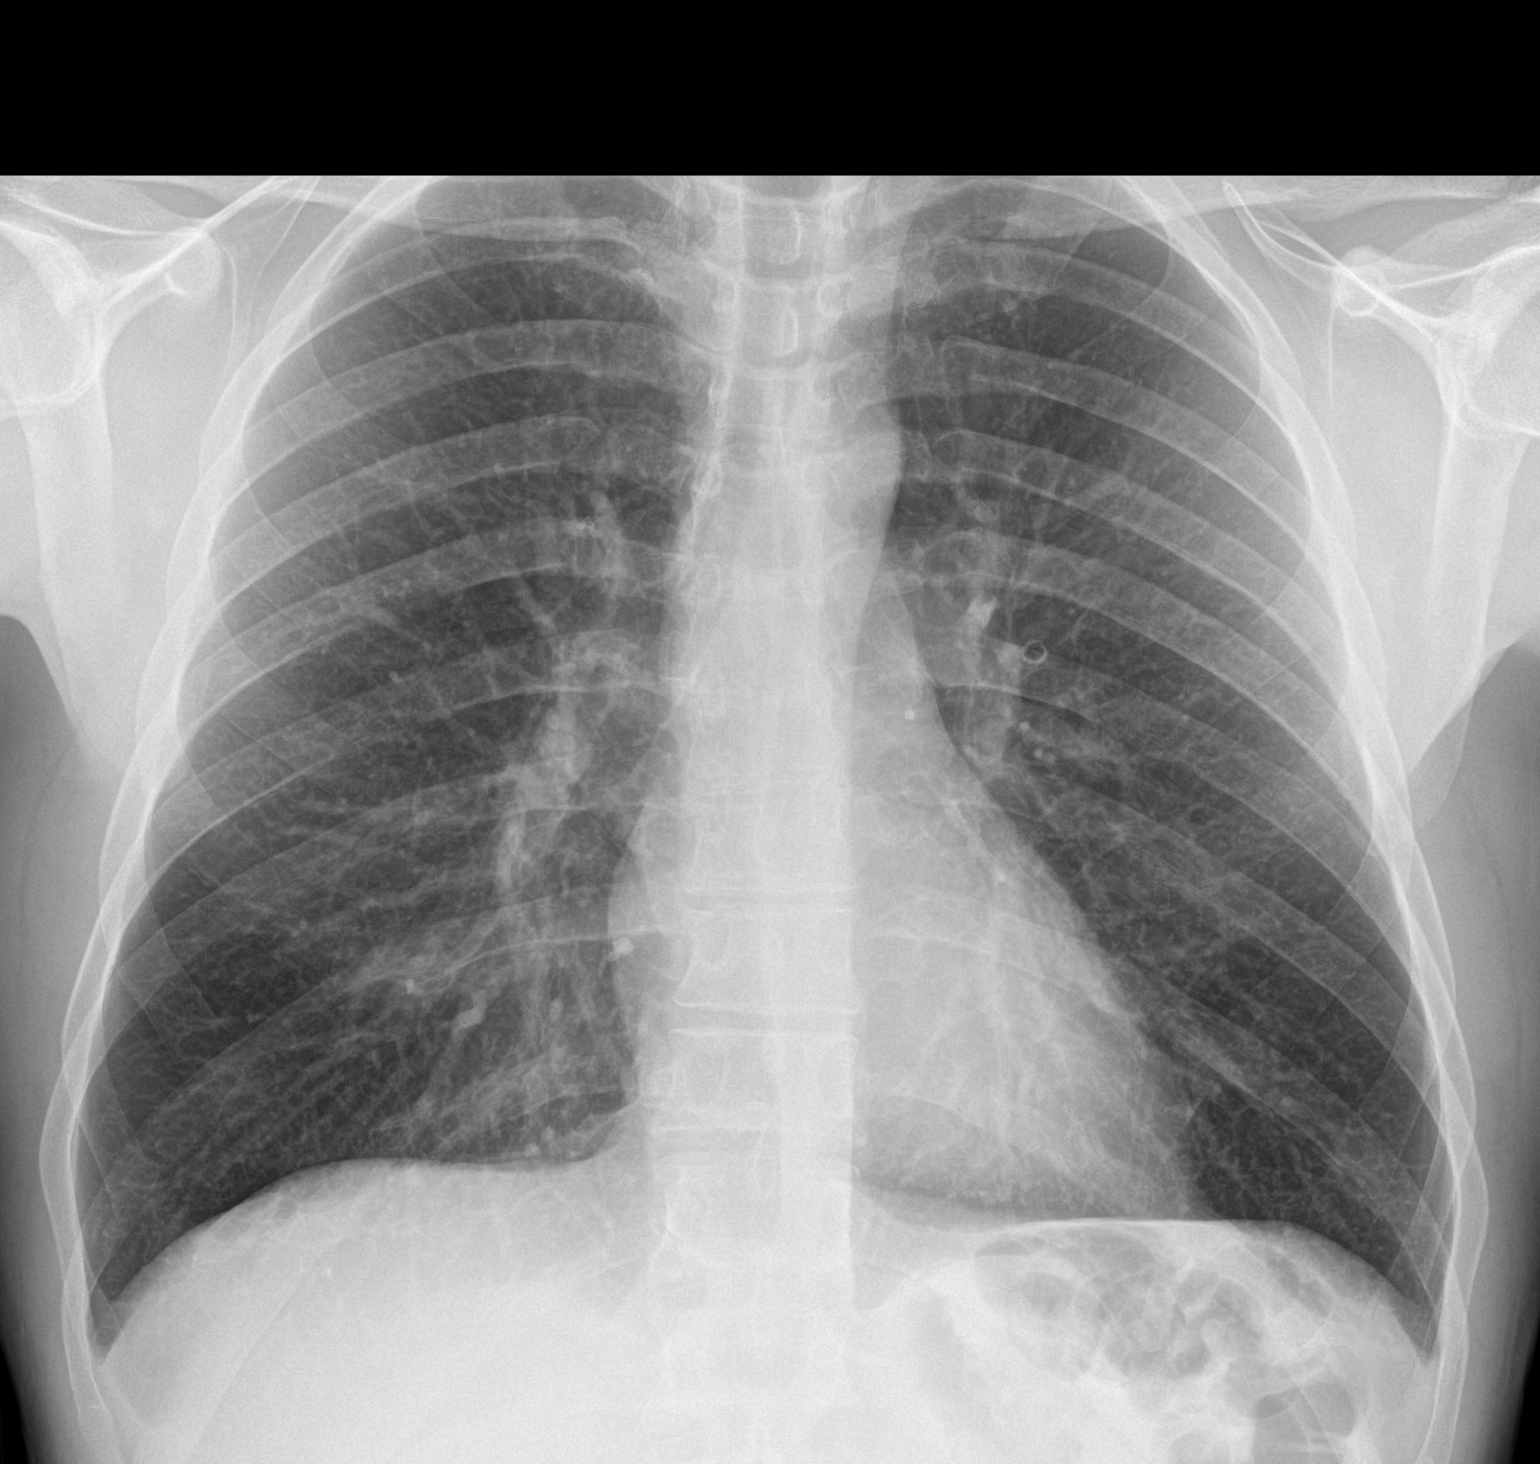
[im 2/2]
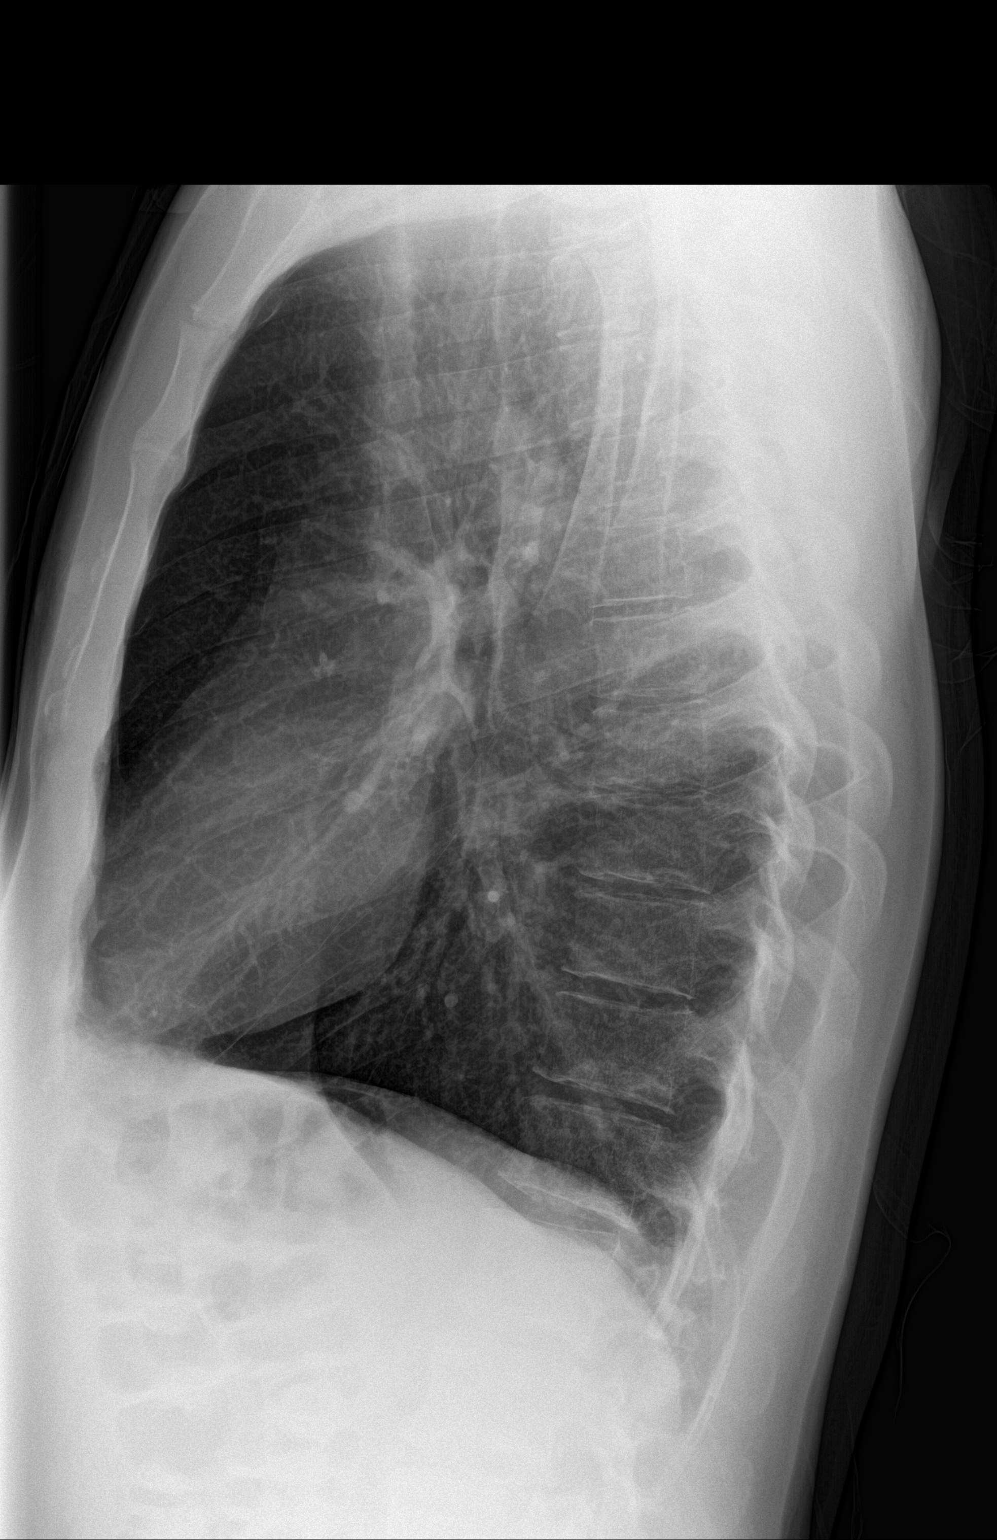

[2 of 2 positions shown; findings below may reference images not displayed]

FINDINGS: Lungs are clear. Heart size and pulmonary vascularity are normal. No
adenopathy. There is an old healed fracture of the lateral left
clavicle. No pneumothorax.
IMPRESSION: No edema or consolidation.

## 2019-04-09 IMAGING — CR DG CHEST 2V
1 series · 2 of 2 positions shown · non-contrast
Comparison: PA and lateral chest 10/25/2016 and 10/03/2015.

CLINICAL DATA: Left chest pain, cough and shortness of breath for 2
weeks.

EXAM:
CHEST  2 VIEW

[Series 1: dg chest 2 view · 0.14mm/px · 2 of 2 slices shown]
[im 1/2]
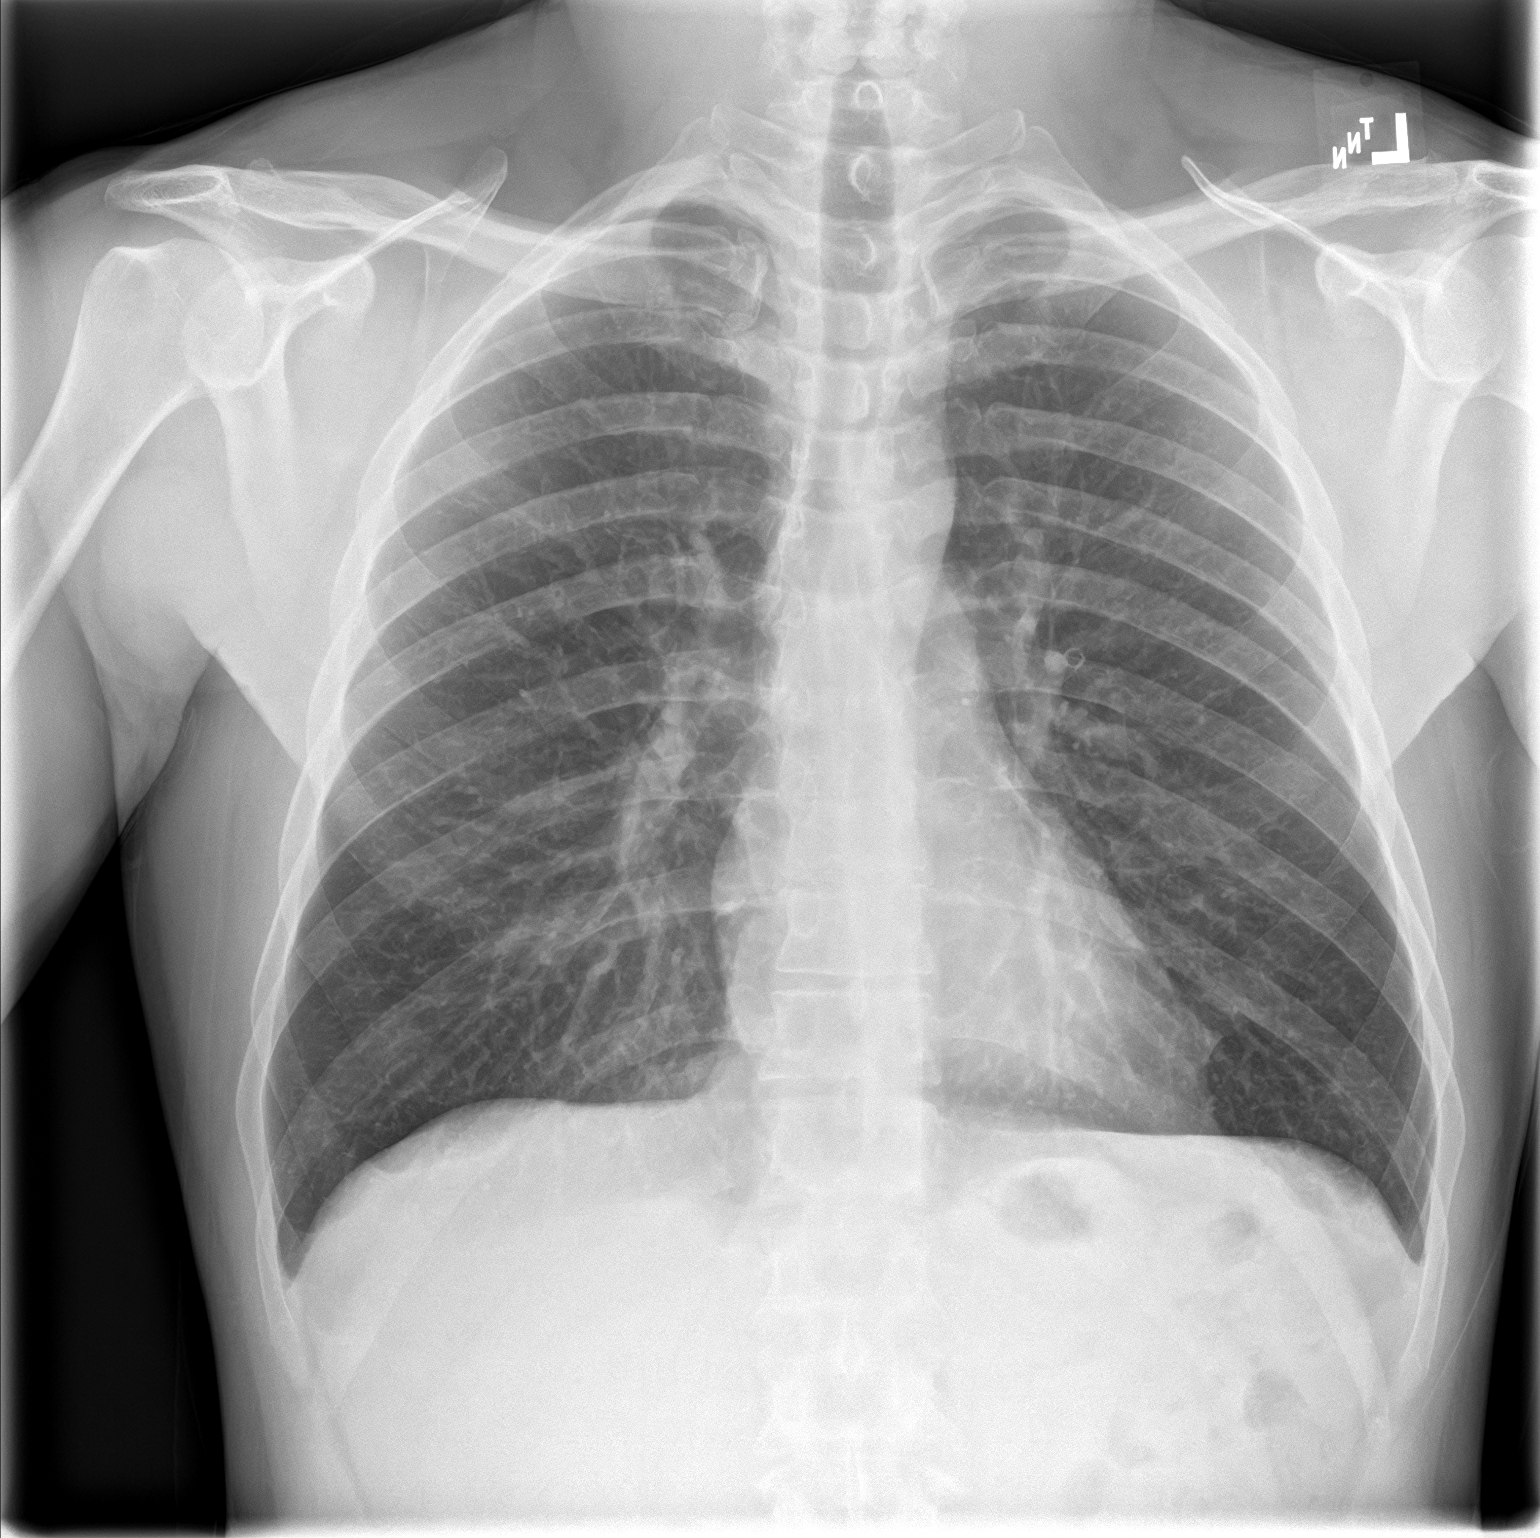
[im 2/2]
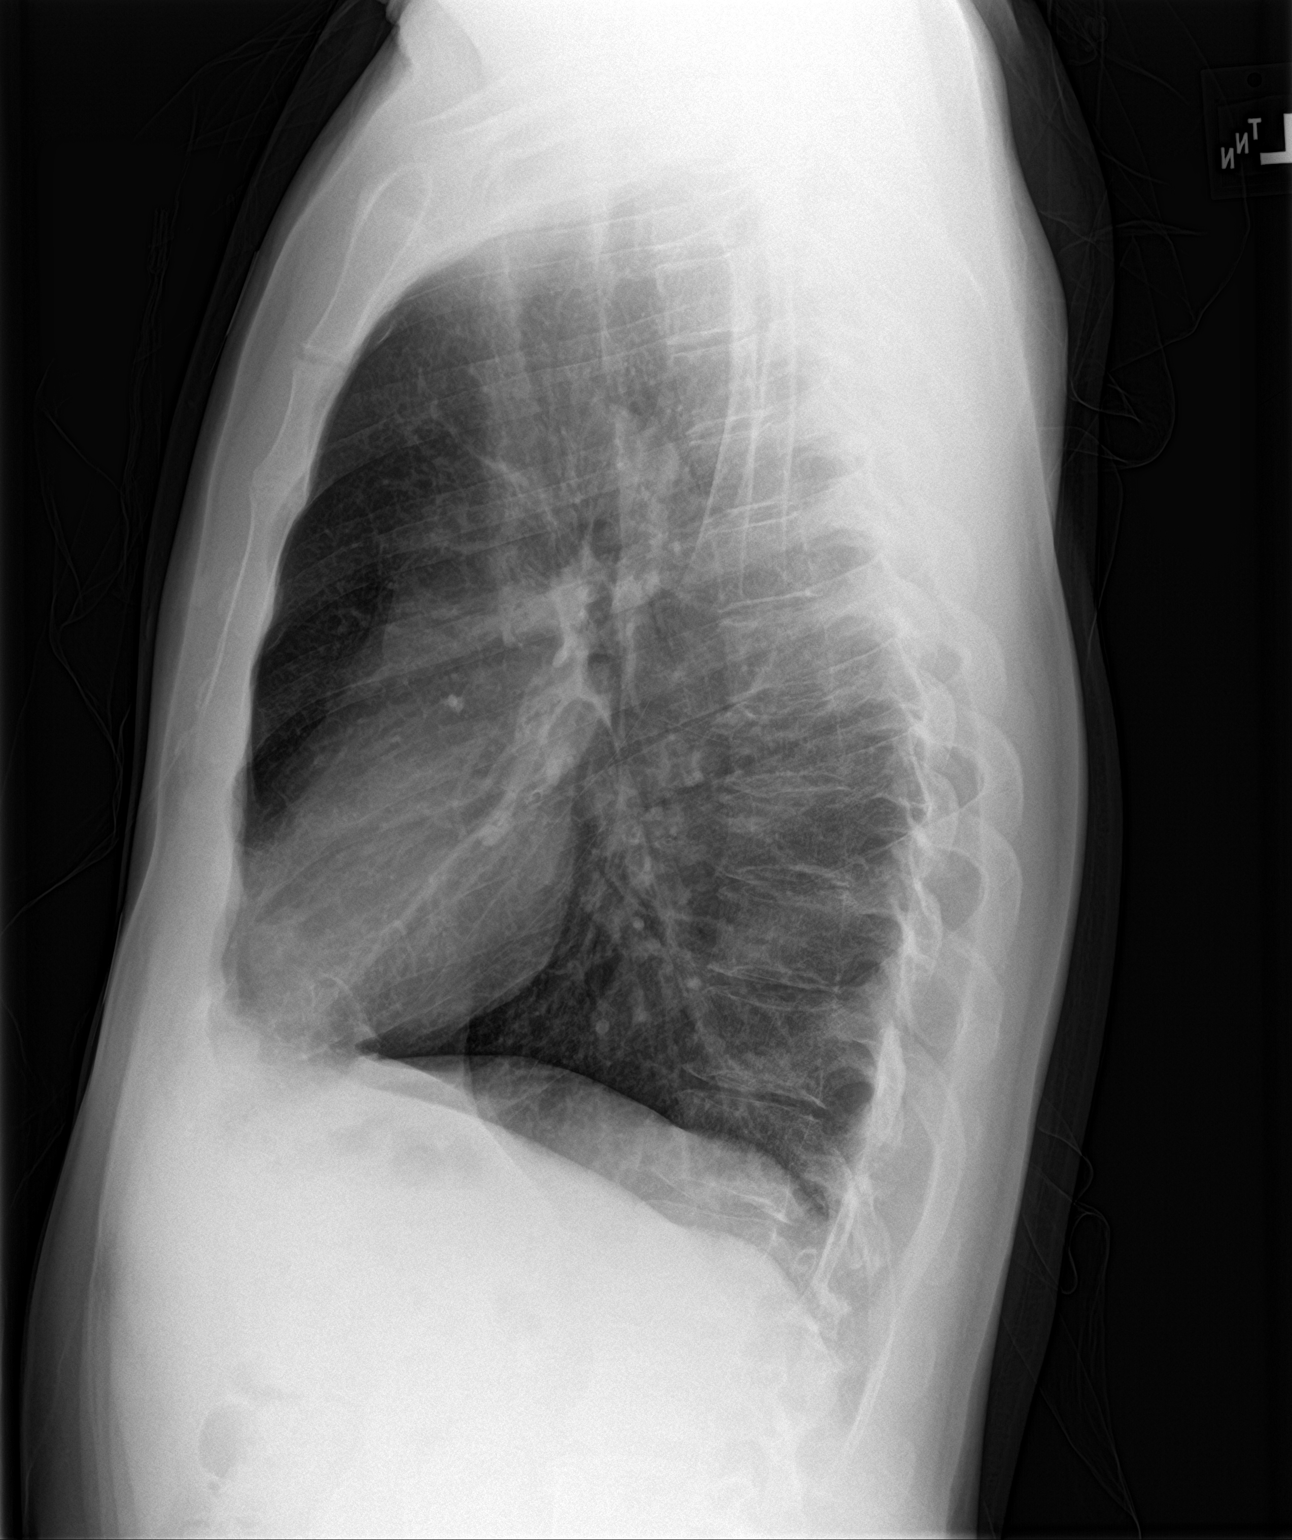

[2 of 2 positions shown; findings below may reference images not displayed]

FINDINGS: Lungs clear. Heart size normal. No pneumothorax or pleural fluid. No
bony abnormality.
IMPRESSION: Normal chest.

## 2021-12-14 DIAGNOSIS — R059 Cough, unspecified: Secondary | ICD-10-CM | POA: Diagnosis not present

## 2021-12-14 DIAGNOSIS — Z0000 Encounter for general adult medical examination without abnormal findings: Secondary | ICD-10-CM | POA: Diagnosis not present

## 2021-12-14 DIAGNOSIS — J40 Bronchitis, not specified as acute or chronic: Secondary | ICD-10-CM | POA: Diagnosis not present

## 2022-01-04 DIAGNOSIS — J45902 Unspecified asthma with status asthmaticus: Secondary | ICD-10-CM | POA: Diagnosis not present

## 2023-11-22 ENCOUNTER — Encounter: Payer: Self-pay | Admitting: Internal Medicine

## 2023-11-23 ENCOUNTER — Ambulatory Visit
Admission: RE | Admit: 2023-11-23 | Discharge: 2023-11-23 | Disposition: A | Discharge status: Home / Self Care | Payer: 59 | Attending: Internal Medicine | Admitting: Internal Medicine

## 2023-11-23 ENCOUNTER — Ambulatory Visit: Payer: 59 | Admitting: Anesthesiology

## 2023-11-23 ENCOUNTER — Ambulatory Visit: Admit: 2023-11-23 | Payer: BLUE CROSS/BLUE SHIELD | Admitting: Internal Medicine

## 2023-11-23 ENCOUNTER — Encounter
Admission: RE | Disposition: A | Discharge status: Home / Self Care | Payer: Self-pay | Source: Home / Self Care | Attending: Internal Medicine

## 2023-11-23 DIAGNOSIS — D122 Benign neoplasm of ascending colon: Secondary | ICD-10-CM | POA: Insufficient documentation

## 2023-11-23 DIAGNOSIS — K2970 Gastritis, unspecified, without bleeding: Secondary | ICD-10-CM | POA: Diagnosis not present

## 2023-11-23 DIAGNOSIS — Z1211 Encounter for screening for malignant neoplasm of colon: Secondary | ICD-10-CM | POA: Diagnosis present

## 2023-11-23 DIAGNOSIS — K5730 Diverticulosis of large intestine without perforation or abscess without bleeding: Secondary | ICD-10-CM | POA: Insufficient documentation

## 2023-11-23 DIAGNOSIS — F17290 Nicotine dependence, other tobacco product, uncomplicated: Secondary | ICD-10-CM | POA: Insufficient documentation

## 2023-11-23 DIAGNOSIS — K254 Chronic or unspecified gastric ulcer with hemorrhage: Secondary | ICD-10-CM | POA: Diagnosis not present

## 2023-11-23 DIAGNOSIS — K2100 Gastro-esophageal reflux disease with esophagitis, without bleeding: Secondary | ICD-10-CM | POA: Insufficient documentation

## 2023-11-23 DIAGNOSIS — K222 Esophageal obstruction: Secondary | ICD-10-CM | POA: Insufficient documentation

## 2023-11-23 DIAGNOSIS — K640 First degree hemorrhoids: Secondary | ICD-10-CM | POA: Diagnosis not present

## 2023-11-23 DIAGNOSIS — R1012 Left upper quadrant pain: Secondary | ICD-10-CM | POA: Diagnosis present

## 2023-11-23 DIAGNOSIS — D123 Benign neoplasm of transverse colon: Secondary | ICD-10-CM | POA: Insufficient documentation

## 2023-11-23 HISTORY — PX: BIOPSY: SHX5522

## 2023-11-23 HISTORY — PX: HEMOSTASIS CLIP PLACEMENT: SHX6857

## 2023-11-23 HISTORY — PX: POLYPECTOMY: SHX5525

## 2023-11-23 HISTORY — PX: ESOPHAGOGASTRODUODENOSCOPY: SHX5428

## 2023-11-23 HISTORY — PX: SUBMUCOSAL TATTOO INJECTION: SHX6856

## 2023-11-23 HISTORY — PX: COLONOSCOPY: SHX5424

## 2023-11-23 SURGERY — COLONOSCOPY
Anesthesia: General

## 2023-11-23 SURGERY — COLONOSCOPY WITH PROPOFOL
Anesthesia: General

## 2023-11-23 MED ORDER — GLYCOPYRROLATE 0.2 MG/ML IJ SOLN
INTRAMUSCULAR | Status: DC | PRN
  Administered 2023-11-23: .2 mg via INTRAVENOUS

## 2023-11-23 MED ORDER — PROPOFOL 10 MG/ML IV BOLUS
INTRAVENOUS | Status: DC | PRN
  Administered 2023-11-23: 100 mg via INTRAVENOUS

## 2023-11-23 MED ORDER — DEXMEDETOMIDINE HCL IN NACL 80 MCG/20ML IV SOLN
INTRAVENOUS | Status: DC | PRN
  Administered 2023-11-23: 12 ug via INTRAVENOUS
  Administered 2023-11-23: 20 ug via INTRAVENOUS
  Administered 2023-11-23: 8 ug via INTRAVENOUS

## 2023-11-23 MED ORDER — SODIUM CHLORIDE 0.9 % IV SOLN
INTRAVENOUS | Status: DC
  Administered 2023-11-23: 20 mL/h via INTRAVENOUS

## 2023-11-23 MED ORDER — LIDOCAINE HCL (CARDIAC) PF 100 MG/5ML IV SOSY
PREFILLED_SYRINGE | INTRAVENOUS | Status: DC | PRN
  Administered 2023-11-23: 70 mg via INTRAVENOUS

## 2023-11-23 MED ORDER — PROPOFOL 500 MG/50ML IV EMUL
INTRAVENOUS | Status: DC | PRN
  Administered 2023-11-23: 200 ug/kg/min via INTRAVENOUS

## 2023-11-23 NOTE — Op Note (Signed)
 [facility] Patient Name: Danny Downs Procedure Date: 11/23/2023 11:06 AM MRN: [id number] Account #: [id number] Date of Birth: 04/21/1976 Admit Type: Outpatient Age: 48 Room: [facility] ENDO ROOM 2 Gender: Male Note Status: Finalized Instrument Name: Upper Endoscope [id number] Procedure:             Upper GI endoscopy Indications:           Abdominal pain in the left upper quadrant, Suspected                         esophageal reflux, Failure to respond to medical                         treatment, Nausea with vomiting Providers:             Breyonna Nault K. Lisha Vitale MD, MD Medicines:             Propofol  per Anesthesia Complications:         No immediate complications. Estimated blood loss:                         Minimal. Procedure:             Pre-Anesthesia Assessment:                        - The risks and benefits of the procedure and the                         sedation options and risks were discussed with the                         patient. All questions were answered and informed                         consent was obtained.                        - Patient identification and proposed procedure were                         verified prior to the procedure by the nurse. The                         procedure was verified in the procedure room.                        - ASA Grade Assessment: II - A patient with mild                         systemic disease.                        - After reviewing the risks and benefits, the patient                         was deemed in satisfactory condition to undergo the                         procedure.  After obtaining informed consent, the endoscope was                         passed under direct vision. Throughout the procedure,                         the patient's blood pressure, pulse, and oxygen                         saturations were monitored continuously. The Endoscope                          was introduced through the mouth, and advanced to the                         third part of duodenum. The upper GI endoscopy was                         accomplished without difficulty. The patient tolerated                         the procedure well. Findings:      LA Grade A (one or more mucosal breaks less than 5 mm, not extending       between tops of 2 mucosal folds) esophagitis with no bleeding was found       in the distal esophagus.      Two benign-appearing, intrinsic mild (non-circumferential scarring)       stenoses were found in the distal esophagus. The narrowest stenosis       measured 1.7 cm (inner diameter) x less than one cm (in length). The       stenoses were traversed.      The exam of the esophagus was otherwise normal.      Biopsies were obtained from the proximal and distal esophagus with cold       forceps for histology of suspected eosinophilic esophagitis.      Patchy mild inflammation characterized by erosions and erythema was       found in the gastric antrum. Biopsies were taken with a cold forceps for       Helicobacter pylori testing.      One non-bleeding superficial gastric ulcer with adherent clot was found       in the gastric antrum. The lesion was 8 mm in largest dimension.      The exam of the stomach was otherwise normal.      The examined duodenum was normal. Impression:            - LA Grade A reflux esophagitis with no bleeding.                        - Benign-appearing esophageal stenoses.                        - Gastritis. Biopsied.                        - Non-bleeding gastric ulcer with adherent clot.                        - Normal examined duodenum.                        -  Biopsies were taken with a cold forceps for                         evaluation of eosinophilic esophagitis. Recommendation:        - Await pathology results.                        - Use Protonix  (pantoprazole ) 40 mg PO daily.                        - No  aspirin, ibuprofen, naproxen, or other                         non-steroidal anti-inflammatory drugs.                        - Proceed with colonoscopy Procedure Code(s):     --- Professional ---                        [phone], Esophagogastroduodenoscopy, flexible,                         transoral; with biopsy, single or multiple Diagnosis Code(s):     --- Professional ---                        R11.2, Nausea with vomiting, unspecified                        R10.12, Left upper quadrant pain                        K25.4, Chronic or unspecified gastric ulcer with                         hemorrhage                        K29.70, Gastritis, unspecified, without bleeding                        K22.2, Esophageal obstruction                        K21.00, Gastro-esophageal reflux disease with                         esophagitis, without bleeding CPT copyright 2022 American Medical Association. All rights reserved. The codes documented in this report are preliminary and upon coder review may  be revised to meet current compliance requirements. Ladell MARLA Boss MD, MD 11/23/2023 11:20:11 AM This report has been signed electronically. Number of Addenda: 0 Note Initiated On: 11/23/2023 11:06 AM Estimated Blood Loss:  Estimated blood loss was minimal.      [facility]

## 2023-11-23 NOTE — Anesthesia Postprocedure Evaluation (Signed)
 Anesthesia Post Note  Patient: Danny Downs  Procedure(s) Performed: COLONOSCOPY ESOPHAGOGASTRODUODENOSCOPY (EGD) BIOPSY POLYPECTOMY HEMOSTASIS CLIP PLACEMENT SUBMUCOSAL TATTOO INJECTION  Patient location during evaluation: Endoscopy Anesthesia Type: General Level of consciousness: awake and alert Pain management: pain level controlled Vital Signs Assessment: post-procedure vital signs reviewed and stable Respiratory status: spontaneous breathing, nonlabored ventilation, respiratory function stable and patient connected to nasal cannula oxygen Cardiovascular status: blood pressure returned to baseline and stable Postop Assessment: no apparent nausea or vomiting Anesthetic complications: no   No notable events documented.   Last Vitals:  Vitals:   11/23/23 1152 11/23/23 1202  BP: 97/67 99/67  Pulse: 84   Resp:    Temp: (!) 36.1 C   SpO2: 94%     Last Pain:  Vitals:   11/23/23 1222  TempSrc:   PainSc: 0-No pain                 Fairy POUR Ova Gillentine

## 2023-11-23 NOTE — Transfer of Care (Signed)
 Immediate Anesthesia Transfer of Care Note  Patient: Danny Downs  Procedure(s) Performed: COLONOSCOPY ESOPHAGOGASTRODUODENOSCOPY (EGD) BIOPSY POLYPECTOMY HEMOSTASIS CLIP PLACEMENT SUBMUCOSAL TATTOO INJECTION  Patient Location: PACU  Anesthesia Type:General  Level of Consciousness: sedated  Airway & Oxygen Therapy: Patient Spontanous Breathing  Post-op Assessment: Report given to RN and Post -op Vital signs reviewed and stable  Post vital signs: Reviewed and stable  Last Vitals:  Vitals Value Taken Time  BP 97/67 11/23/23 1151  Temp    Pulse 84 11/23/23 1154  Resp    SpO2 93 % 11/23/23 1154  Vitals shown include unfiled device data.  Last Pain:  Vitals:   11/23/23 1003  TempSrc: Temporal  PainSc: 0-No pain         Complications: No notable events documented.

## 2023-11-23 NOTE — H&P (Signed)
 Outpatient short stay form Pre-procedure 11/23/2023 10:51 AM Danny Downs, M.D.  Primary Physician: Danny Downs, M.D.  Reason for visit:  Nausea and vomiting, GERD, LUQ pain, Colon cancer screening  History of present illness:  Danny Downs presents to the [facility] clinic at the request of Danny Downs in [facility] for chief complaint of non-intractable nausea and vomiting, GERD, LUQ abdominal pain, and change in bowel habits. He reports he has had these symptoms for a little over 3 years. He was seen in the [facility] Downs in June by Danny Downs for these symptoms where he had CBC, CMP, and lipase performed which were negative. He was sent in Zofran  and Tramadol and told to establish care with GI. He endorses chronic LUQ abdominal pain which is described as dull and sometimes sharp. Pain is not clearly postprandial. Pain can last for hours. He has associated heartburn, indigestion, nausea, and vomiting. He reports he can throw up stomach acid couple times a week. He takes omeprazole 20 mg twice daily for GERD symptoms. He doesn't feel like it is working very well. He denies any issues with esophageal dysphagia, odynophagia, early satiety, hoarseness, or unintentional weight loss. He does endorse excessive EtOH consumption and NSAID use. He used to take ibuprofen 800 mg daily, but has since switched to alternating between ibuprofen and Tylenol . He drinks 6-8 beers on a Friday and Saturday. He has quit drinking during the week. He used to do 2-3 cocktails a night during the week but has tried to stop drinking during the week. He uses marijuana at nighttime to help him relax and sleep. He does vape daily. He can have occasional diarrhea. He denies any hematochezia or melena. He has never had a colonoscopy.     Current Facility-Administered Medications:    0.9 %  sodium chloride  infusion, , Intravenous, Continuous, Danny Downs, Danny Downs, Danny Downs, Last Rate: 20 mL/hr at 11/23/23 1015, 20 mL/hr at 11/23/23 1015  Medications Prior  to Admission  Medication Sig Dispense Refill Last Dose/Taking   dextromethorphan-guaiFENesin (MUCINEX DM) 30-600 MG 12hr tablet Take 1 tablet by mouth 2 (two) times daily.   Past Week   Pseudoeph-Doxylamine-DM-APAP (NYQUIL PO) Take by mouth.   Past Week   escitalopram  (LEXAPRO ) 10 MG tablet Take 1 tablet (10 mg total) by mouth daily. 30 tablet 2      No Known Allergies   Past Medical History:  Diagnosis Date   Heart valve disorder     Review of systems:  Otherwise negative.    Physical Exam  Gen: Alert, oriented. Appears stated age.  HEENT: Danny Downs. PERRLA. Lungs: CTA, no wheezes. CV: RR nl S1, S2. Abd: soft, benign, no masses. BS+ Ext: No edema. Pulses 2+    Planned procedures: Proceed with EGD and colonoscopy. The patient understands the nature of the planned procedure, indications, risks, alternatives and potential complications including but not limited to bleeding, infection, perforation, damage to internal organs and possible oversedation/side effects from anesthesia. The patient agrees and gives consent to proceed.  Please refer to procedure notes for findings, recommendations and patient disposition/instructions.     Danny Downs. Downs, M.D. Gastroenterology 11/23/2023  10:51 AM

## 2023-11-23 NOTE — Interval H&P Note (Signed)
 History and Physical Interval Note:  11/23/2023 10:53 AM  Danny Downs  has presented today for surgery, with the diagnosis of Nausea and vomiting, unspecified vomiting type (R11.2) Abdominal pain, LUQ (left upper quadrant) (R10.12).  The various methods of treatment have been discussed with the patient and family. After consideration of risks, benefits and other options for treatment, the patient has consented to  Procedure(s): COLONOSCOPY (N/A) ESOPHAGOGASTRODUODENOSCOPY (EGD) (N/A) as a surgical intervention.  The patient's history has been reviewed, patient examined, no change in status, stable for surgery.  I have reviewed the patient's chart and labs.  Questions were answered to the patient's satisfaction.     Globe, [name]

## 2023-11-23 NOTE — Op Note (Signed)
 [facility] Patient Name: Danny Downs Procedure Date: 11/23/2023 11:06 AM MRN: [id number] Account #: [id number] Date of Birth: 07-Sep-1976 Admit Type: Outpatient Age: 48 Room: [facility] ENDO ROOM 2 Gender: Male Note Status: Finalized Instrument Name: Arvis [id number] Procedure:             Colonoscopy Indications:           Screening for colorectal malignant neoplasm Providers:             Danne Scardina K. Bettie Capistran MD, MD Medicines:             Propofol  per Anesthesia Complications:         No immediate complications. Estimated blood loss: None. Procedure:             Pre-Anesthesia Assessment:                        - Prior to the procedure, a History and Physical was                         performed, and patient medications, allergies and                         sensitivities were reviewed. The patient's tolerance                         of previous anesthesia was reviewed.                        - The risks and benefits of the procedure and the                         sedation options and risks were discussed with the                         patient. All questions were answered and informed                         consent was obtained.                        - Patient identification and proposed procedure were                         verified prior to the procedure by the nurse. The                         procedure was verified in the procedure room.                        - ASA Grade Assessment: II - A patient with mild                         systemic disease.                        - After reviewing the risks and benefits, the patient                         was deemed in satisfactory condition to undergo the  procedure.                        After obtaining informed consent, the colonoscope was                         passed under direct vision. Throughout the procedure,                         the patient's blood pressure,  pulse, and oxygen                         saturations were monitored continuously. The                         Colonoscope was introduced through the anus and                         advanced to the the cecum, identified by appendiceal                         orifice and ileocecal valve. The colonoscopy was                         performed without difficulty. The patient tolerated                         the procedure well. The quality of the bowel                         preparation was adequate. The ileocecal valve,                         appendiceal orifice, and rectum were photographed. Findings:      The perianal and digital rectal examinations were normal. Pertinent       negatives include normal sphincter tone and no palpable rectal lesions.      Non-bleeding internal hemorrhoids were found during retroflexion. The       hemorrhoids were Grade I (internal hemorrhoids that do not prolapse).      A few small-mouthed diverticula were found in the sigmoid colon.      A 5 mm polyp was found in the proximal ascending colon. The polyp was       sessile. The polyp was removed with a jumbo cold forceps. Resection and       retrieval were complete.      A 14 mm polyp was found in the proximal ascending colon. The polyp was       semi-pedunculated. The polyp was removed with a hot snare. Resection and       retrieval were complete. To prevent bleeding after the polypectomy, one       hemostatic clip was successfully placed (MR conditional). Clip       manufacturer: [employer]. There was no bleeding during, or at the       end, of the procedure.      A 16 mm polyp was found in the hepatic flexure. The polyp was       semi-pedunculated. The polyp was removed with a hot snare. Resection and       retrieval were complete. To prevent bleeding  after the polypectomy, one       hemostatic clip was successfully placed (MR conditional). Clip       manufacturer: [employer]. There was no  bleeding during, or at the       end, of the procedure.      A 35 mm polyp was found in the hepatic flexure. The polyp was       multi-lobulated and pedunculated. The polyp was removed with a hot       snare. Resection and retrieval were complete. To prevent bleeding after       the polypectomy, two hemostatic clips were successfully placed (MR       conditional). Clip manufacturer: [employer]. There was no       bleeding during, or at the end, of the procedure. Estimated blood loss:       none. Area was successfully injected with 3 mL Spot (carbon black) for       tattooing. Estimated blood loss: none.      A 10 mm polyp was found in the transverse colon. The polyp was sessile.       The polyp was removed with a hot snare. Resection and retrieval were       complete.      The exam was otherwise without abnormality. Impression:            - Non-bleeding internal hemorrhoids.                        - Diverticulosis in the sigmoid colon.                        - One 5 mm polyp in the proximal ascending colon,                         removed with a jumbo cold forceps. Resected and                         retrieved.                        - One 14 mm polyp in the proximal ascending colon,                         removed with a hot snare. Resected and retrieved. Clip                         (MR conditional) was placed. Clip manufacturer: [employer].                        - One 16 mm polyp at the hepatic flexure, removed with                         a hot snare. Resected and retrieved. Clip (MR                         conditional) was placed. Clip manufacturer: [employer].                        - One 35 mm polyp at the hepatic flexure, removed with                         a hot snare. Resected and retrieved. Clips (MR                         conditional) were placed. Clip manufacturer: [employer]. Injected.                        - One 10 mm polyp in the transverse colon, removed                         with a hot snare. Resected and retrieved.                        - The examination was otherwise normal. Recommendation:        - Await pathology results from EGD, also performed                         today.                        - Patient has a contact number available for                         emergencies. The signs and symptoms of potential                         delayed complications were discussed with the patient.                         Return to normal activities tomorrow. Written                         discharge instructions were provided to the patient.                        - Resume previous diet.                        - Continue present medications.                        - Await pathology results.                        - Repeat colonoscopy is recommended for surveillance.                         The colonoscopy date will be determined after                         pathology results from today's exam become available  for review.                        - Return to GI office in 3 months.                        - Use Protonix  (pantoprazole ) 40 mg PO daily.                        - No aspirin, ibuprofen, naproxen, or other                         non-steroidal anti-inflammatory drugs.                        - Telephone GI office to schedule appointment.                        - The findings and recommendations were discussed with                         the patient. Procedure Code(s):     --- Professional ---                        [phone], Colonoscopy, flexible; with removal of                         tumor(s), polyp(s), or other lesion(s) by snare                         technique                        45380, 59, Colonoscopy, flexible; with biopsy, single                         or multiple                        45381,  Colonoscopy, flexible; with directed submucosal                         injection(s), any substance Diagnosis Code(s):     --- Professional ---                        K57.30, Diverticulosis of large intestine without                         perforation or abscess without bleeding                        D12.3, Benign neoplasm of transverse colon (hepatic                         flexure or splenic flexure)                        D12.2, Benign neoplasm of ascending colon                        K64.0, First degree hemorrhoids  Z12.11, Encounter for screening for malignant neoplasm                         of colon CPT copyright 2022 American Medical Association. All rights reserved. The codes documented in this report are preliminary and upon coder review may  be revised to meet current compliance requirements. Ladell MARLA Boss MD, MD 11/23/2023 11:58:15 AM This report has been signed electronically. Number of Addenda: 0 Note Initiated On: 11/23/2023 11:06 AM Scope Withdrawal Time: 0 hours 23 minutes 56 seconds  Total Procedure Duration: 0 hours 27 minutes 27 seconds  Estimated Blood Loss:  Estimated blood loss: none.      [facility]

## 2023-11-23 NOTE — Anesthesia Preprocedure Evaluation (Signed)
 Anesthesia Evaluation  Patient identified by MRN, date of birth, ID band Patient awake    Reviewed: Allergy & Precautions, NPO status , Patient's Chart, lab work & pertinent test results  History of Anesthesia Complications Negative for: history of anesthetic complications  Airway Mallampati: III  TM Distance: <3 FB Neck ROM: full    Dental  (+) Chipped, Poor Dentition   Pulmonary neg shortness of breath, Current Smoker and Patient abstained from smoking.   Pulmonary exam normal        Cardiovascular Exercise Tolerance: Good (-) angina negative cardio ROS Normal cardiovascular exam     Neuro/Psych negative neurological ROS  negative psych ROS   GI/Hepatic negative GI ROS, Neg liver ROS,neg GERD  ,,  Endo/Other  negative endocrine ROS    Renal/GU negative Renal ROS  negative genitourinary   Musculoskeletal   Abdominal   Peds  Hematology negative hematology ROS (+)   Anesthesia Other Findings Patient is NPO appropriate and reports no nausea or vomiting today.  Past Surgical History: No date: HERNIA REPAIR  BMI    Body Mass Index: 28.43 kg/m      Reproductive/Obstetrics negative OB ROS                             Anesthesia Physical Anesthesia Plan  ASA: 2  Anesthesia Plan: General   Post-op Pain Management:    Induction: Intravenous  PONV Risk Score and Plan: Propofol  infusion and TIVA  Airway Management Planned: Natural Airway and Nasal Cannula  Additional Equipment:   Intra-op Plan:   Post-operative Plan:   Informed Consent: I have reviewed the patients History and Physical, chart, labs and discussed the procedure including the risks, benefits and alternatives for the proposed anesthesia with the patient or authorized representative who has indicated his/her understanding and acceptance.     Dental Advisory Given  Plan Discussed with: Anesthesiologist, CRNA and  Surgeon  Anesthesia Plan Comments: (Patient consented for risks of anesthesia including but not limited to:  - adverse reactions to medications - risk of airway placement if required - damage to eyes, teeth, lips or other oral mucosa - nerve damage due to positioning  - sore throat or hoarseness - Damage to heart, brain, nerves, lungs, other parts of body or loss of life  Patient voiced understanding and assent.)       Anesthesia Quick Evaluation

## 2023-11-24 ENCOUNTER — Encounter: Payer: Self-pay | Admitting: Internal Medicine

## 2023-11-24 LAB — SURGICAL PATHOLOGY

## 2024-05-05 ENCOUNTER — Other Ambulatory Visit: Payer: Self-pay

## 2024-05-05 ENCOUNTER — Emergency Department
Admission: EM | Admit: 2024-05-05 | Discharge: 2024-05-05 | Disposition: A | Discharge status: Home / Self Care | Payer: Self-pay | Attending: Emergency Medicine | Admitting: Emergency Medicine

## 2024-05-05 DIAGNOSIS — F17210 Nicotine dependence, cigarettes, uncomplicated: Secondary | ICD-10-CM | POA: Insufficient documentation

## 2024-05-05 DIAGNOSIS — F1010 Alcohol abuse, uncomplicated: Secondary | ICD-10-CM | POA: Insufficient documentation

## 2024-05-05 DIAGNOSIS — F32A Depression, unspecified: Secondary | ICD-10-CM | POA: Diagnosis present

## 2024-05-05 DIAGNOSIS — F10129 Alcohol abuse with intoxication, unspecified: Secondary | ICD-10-CM

## 2024-05-05 DIAGNOSIS — Y908 Blood alcohol level of 240 mg/100 ml or more: Secondary | ICD-10-CM | POA: Diagnosis not present

## 2024-05-05 LAB — CBC
HCT: 44.9 % (ref 39.0–52.0)
Hemoglobin: 15.5 g/dL (ref 13.0–17.0)
MCH: 29.9 pg (ref 26.0–34.0)
MCHC: 34.5 g/dL (ref 30.0–36.0)
MCV: 86.7 fL (ref 80.0–100.0)
Platelets: 349 10*3/uL (ref 150–400)
RBC: 5.18 MIL/uL (ref 4.22–5.81)
RDW: 12.1 % (ref 11.5–15.5)
WBC: 8.6 10*3/uL (ref 4.0–10.5)
nRBC: 0 % (ref 0.0–0.2)

## 2024-05-05 LAB — COMPREHENSIVE METABOLIC PANEL WITH GFR
ALT: 38 U/L (ref 0–44)
AST: 36 U/L (ref 15–41)
Albumin: 4.2 g/dL (ref 3.5–5.0)
Alkaline Phosphatase: 50 U/L (ref 38–126)
Anion gap: 7 (ref 5–15)
BUN: 11 mg/dL (ref 6–20)
CO2: 26 mmol/L (ref 22–32)
Calcium: 8.7 mg/dL — ABNORMAL LOW (ref 8.9–10.3)
Chloride: 109 mmol/L (ref 98–111)
Creatinine, Ser: 0.92 mg/dL (ref 0.61–1.24)
GFR, Estimated: >60 mL/min (ref >60)
Glucose, Bld: 106 mg/dL — ABNORMAL HIGH (ref 70–99)
Potassium: 3.5 mmol/L (ref 3.5–5.1)
Sodium: 142 mmol/L (ref 135–145)
Total Bilirubin: 0.6 mg/dL (ref 0.0–1.2)
Total Protein: 7.4 g/dL (ref 6.5–8.1)

## 2024-05-05 LAB — SALICYLATE LEVEL: Salicylate Lvl: <7 mg/dL — ABNORMAL LOW (ref 7.0–30.0)

## 2024-05-05 LAB — ETHANOL: Alcohol, Ethyl (B): 303 mg/dL (ref <15)

## 2024-05-05 LAB — ACETAMINOPHEN LEVEL: Acetaminophen (Tylenol), Serum: <10 ug/mL — ABNORMAL LOW (ref 10–30)

## 2024-05-05 NOTE — ED Notes (Signed)
 Patient speaking with TTS and telepsych provider.

## 2024-05-05 NOTE — ED Notes (Signed)
 All of patient's belongings returned to him to prepare for discharge.

## 2024-05-05 NOTE — ED Notes (Signed)
Pt given beverage at this time.

## 2024-05-05 NOTE — ED Notes (Signed)
 With permission from pt, this RN and registration Jon are going through wallet to obtain health insurance card for file. Wallet and insurance card placed back in pt belongings bag.

## 2024-05-05 NOTE — ED Provider Notes (Signed)
 [facility] Provider Note    Event Date/Time   First MD Initiated Contact with Patient 05/05/24 1701     (approximate)   History   Psychiatric Evaluation   HPI  Danny Downs is a 48 y.o. male who presents to the emergency department today because of concerns for thoughts of suicidal ideation.  Patient states he has had similar thoughts in the past.  Things have been getting worse over the past few weeks and is gradually built up.  He says he has had life stressors.  He does not elaborate with me what those are.  He does however state that he does not think he would ever act on his thoughts.  He denies having spoken to anybody about this in the past.  Denies any medical complaints.     Physical Exam   Triage Vital Signs: ED Triage Vitals  Encounter Vitals Group     BP 05/05/24 1654 (!) 148/96     Girls Systolic BP Percentile --      Girls Diastolic BP Percentile --      Boys Systolic BP Percentile --      Boys Diastolic BP Percentile --      Pulse Rate 05/05/24 1654 (!) 101     Resp 05/05/24 1654 16     Temp 05/05/24 1654 99.1 F (37.3 C)     Temp Source 05/05/24 1654 Oral     SpO2 05/05/24 1654 95 %     Weight 05/05/24 1652 172 lb (78 kg)     Height 05/05/24 1652 5' 4 (1.626 m)     Head Circumference --      Peak Flow --      Pain Score 05/05/24 1651 0     Pain Loc --      Pain Education --      Exclude from Growth Chart --     Most recent vital signs: Vitals:   05/05/24 1654  BP: (!) 148/96  Pulse: (!) 101  Resp: 16  Temp: 99.1 F (37.3 C)  SpO2: 95%   General: Awake, alert, oriented. CV:  Good peripheral perfusion. Regular rate and rhythm. Resp:  Normal effort. Lungs clear. Abd:  No distention.  Other:  Depressed.   ED Results / Procedures / Treatments   Labs (all labs ordered are listed, but only abnormal results are displayed) Labs Reviewed  COMPREHENSIVE METABOLIC PANEL WITH GFR - Abnormal; Notable for the following  components:      Result Value   Glucose, Bld 106 (*)    Calcium 8.7 (*)    All other components within normal limits  ETHANOL - Abnormal; Notable for the following components:   Alcohol, Ethyl (B) 303 (*)    All other components within normal limits  SALICYLATE LEVEL - Abnormal; Notable for the following components:   Salicylate Lvl <7.0 (*)    All other components within normal limits  ACETAMINOPHEN LEVEL - Abnormal; Notable for the following components:   Acetaminophen (Tylenol), Serum <10 (*)    All other components within normal limits  CBC  URINE DRUG SCREEN, QUALITATIVE ([facility] ONLY)     EKG  None   RADIOLOGY None  PROCEDURES:  Critical Care performed: No    MEDICATIONS ORDERED IN ED: Medications - No data to display   IMPRESSION / MDM / ASSESSMENT AND PLAN / ED COURSE  I reviewed the triage vital signs and the nursing notes.  Differential diagnosis includes, but is not limited to, depression, drug induced mood disorder  Patient's presentation is most consistent with acute presentation with potential threat to life or bodily function.  Patient presents to the emergency department today because of concerns for depression and suicidal thoughts.  Patient denies any medical complaints.  Patient is calm at the time my exam.  Will have psychiatry evaluate.  The patient has been placed in psychiatric observation due to the need to provide a safe environment for the patient while obtaining psychiatric consultation and evaluation, as well as ongoing medical and medication management to treat the patient's condition.  The patient has not been placed under full IVC at this time.    FINAL CLINICAL IMPRESSION(S) / ED DIAGNOSES   Final diagnoses:  Suicidal thoughts      Note:  This document was prepared using Dragon voice recognition software and may include unintentional dictation errors.    Floy Roberts, MD 05/05/24 [phone]

## 2024-05-05 NOTE — Consult Note (Addendum)
 [facility] Consult Note  Patient Name: Danny Downs MRN: [id number] DOB: 27-Aug-1976 DATE OF Consult: 05/05/2024   TELEPSYCHIATRY ATTESTATION & CONSENT  As the provider for this telehealth consult, I attest that I verified the patient's identity using two separate identifiers, introduced myself to the patient, provided my credentials, disclosed my location, and performed this encounter via a HIPAA-compliant, real-time, face-to-face, two-way, interactive audio and video platform and with the full consent and agreement of the patient (or guardian as applicable.)  Patient physical location: [facility] [place] [facility] at [facility]   Bed: ED23AA   Telehealth provider physical location: home office in state of [place]  Video scheduled start time: 0930 pm  (Central Time) Video end time: 1008 pm (Central Time)   PRIMARY PSYCHIATRIC DIAGNOSES (ICD-10 format preferred)  Alcohol abuse with intoxication Depressive disorder unspecified Hx of anxiety panic and SSRI challenges in the past  NO SI now ;  sober now   RECOMMENDATIONS      Medication recommendations:  avoid mixing weed and alcohol and zoloft  Non-Medication/therapeutic recommendations:  He would like to go home He does not meet commitment hold  criteria he can be discharged home Referrals to mental health and alcohol counseling indicated No sitter needed  There are no psychiatric contraindications to discharge at this time        Plan Post Discharge/Psychiatric Care Follow-up resources  back to pcp + referrals as noted above  Follow-Up Telepsychiatry C/L services: We will sign off for now. Please re-consult our service if needed for any concerning changes in the patient's condition, discharge planning, or questions.  Communication:  Treatment team members (and family members if applicable) who  were involved in treatment/care discussions and planning, and with whom we spoke  or engaged with  via secure text/chat, include the following: dr floy  Thank you for involving us  in the care of this patient. If you have any additional  questions or concerns, please call [phone] and ask for me or the provider on-call   CHIEF COMPLAINT/REASON FOR CONSULT  SI alcohol intoxication   HISTORY OF PRESENT ILLNESS (HPI)  The patient is 48 yo male. who comes into the emergency room with alcohol intoxication level is 303 Patient admission to emergency room-he was crying, tearful, stating he needs help,  stating that he's depressed, endorsing suicidal thoughts. patients married and  has an 10 year old child. patient has had thoughts of hanging himself in his backyard.   he has not done so because he's a coward. patient has protective factors Which include his wife and son.  Per dr floy patient shared  "He does however state that he does not think he would ever act on his thoughts"  Review of his chart also show is a patient has a history anxiety panic attacks past use of cannabis . he's at medication Trials of Paxil Wellbutrin and also Zoloft in the past   Interview shows  No longer depressed nor suicidal  Affect is returned to baseline No longer tearful Rather euthymic now Admits to heavy drinking and cannabis use mixing it with zoloft Patient claims no past SA No past psych hospitals Denies any plan no intention in wanting to be dead I love myself too much and my son to kill myself No psychosis No mania No agitation  Verbal alert cooperative fluent  He would like to go home He does not meet commitment hold  criteria he can be discharged home Referrals to mental  health and alcohol counseling indicated No sitter needed     PAST PSYCHIATRIC HISTORY     Otherwise as per HPI above.  PAST MEDICAL HISTORY  Past Medical History:  Diagnosis Date   Heart valve disorder       HOME MEDICATIONS  (Not in a hospital admission)       ALLERGIES  No Known Allergies  SOCIAL &  SUBSTANCE USE HISTORY  Social History   Socioeconomic History   Marital status: Married    Spouse name: Not on file   Number of children: Not on file   Years of education: Not on file   Highest education level: Not on file  Occupational History   Not on file  Tobacco Use   Smoking status: Every Day    Types: E-cigarettes    Last attempt to quit: 11/15/2017    Years since quitting: 6.4   Smokeless tobacco: Never   Tobacco comments:    Quit cigs Nov 15, 2017  Vaping Use   Vaping status: Every Day  Substance and Sexual Activity   Alcohol use: Yes    Comment: weekends   Drug use: Yes    Types: Marijuana   Sexual activity: Not on file  Other Topics Concern   Not on file  Social History Narrative   Not on file   Social Drivers of Health   Financial Resource Strain: Not on file  Food Insecurity: Not on file  Transportation Needs: Not on file  Physical Activity: Not on file  Stress: Not on file  Social Connections: Not on file  Alcohol and weed    FAMILY HISTORY  History reviewed. No pertinent family history. Family Psychiatric History (if known):          MENTAL STATUS EXAM (MSE)  Mental Status Exam: General Appearance: Casual  Orientation:  Full (Time, Place, and Person)  Memory:  intact  Concentration:  Concentration: Good  Recall:  Good  Attention  Good  Eye Contact:  Good  Speech:  Clear and Coherent  Language:  Good  Volume:  Normal  Mood: euthymic  Affect:  Appropriate  Thought Process:  Coherent  Thought Content:  Negative  Suicidal Thoughts:  No  Homicidal Thoughts:  No  Judgement:  Good  Insight:  Fair  Psychomotor Activity:  Normal  Akathisia:  No  Fund of Knowledge:  Good    Assets:  Communication Skills Desire for Improvement Vocational/Educational  Cognition:  WNL  ADL's:  Intact  AIMS (if indicated):          VITALS (IF TAKEN)   @VSR @   BP 118/78   Pulse 81   Temp 98.7 F (37.1 C)   Resp 18   Ht 5' 4 (1.626 m)   Wt 78 kg    SpO2 96%   BMI 29.52 kg/m    LABS that are pertinent     ROS & ADDITIONAL FINDINGS  ROS: Notable for the following relevant positive findings: Psychiatric: Si depression  Other notable positive ROS findings: no psychosis no mania  Additional findings:      Musculoskeletal:    [x]  No Abnormal Movements Observed        []  Impaired      Gait & Station:        []  Normal        []  Wheelchair/Walker          [x]  Laying/Sitting       Pain Screening:   [x]  Denies    []   Present--mild to moderate     []  Present--severe (will                             consider referral for ongoing evaluation and treatment)      Nutrition & Dental Concerns:no gross dental or  eating disorder   RISK ASSESSMENT*  Is the patient experiencing any suicidal or homicidal ideations:     [] YES        [x]  NO  Protective factors considered for safety management: wife and son  Risk factors/concerns considered for safety management: (check all that apply) []  Prior attempt                                      []  Hopelessness       []  Family history of suicide                    []  Impulsivity [x]  Depression                                         []  Aggression [x]  Substance abuse/dependence          []  Isolation []  Physical illness/chronic pain              []  Barriers to accessing treatment []  Recent loss                                        []  Unwillingness to seek help []  Access to lethal means                      [x]  Male gender []  Age over 54                                        [x]  Unmarried  Is there a safety management plan with the patient and treatment team to minimize risk factors and promote protective factors:     [x]  YES      []  NO            Explain: routine rn obs       Based on my current evaluation and risk assessment of the patient at the time of this encounter, this patient is considered to be at:   [x]    Low Risk                      []   Moderate Risk                     []   High  Risk  *RISK ASSESSMENT Risk assessment is a dynamic process; it is possible that this patient's condition, and risk level, may change. This should be re-evaluated and managed over time as appropriate. Please re-consult psychiatric consult services if additional assistance is needed in terms of risk assessment and management. If your team decides to discharge this patient, please advise the patient how to best access emergency psychiatric services, or to call 911, if their condition worsens or they feel unsafe in  any way.   CW Lonni Ivanoff, M. D., [facility]

## 2024-05-05 NOTE — ED Triage Notes (Signed)
 Pt to ED voluntarily via [facility] for psych eval. Pt crying in triage. States I don't know, I just need help. States is depressed, endorses SI. States on Zoloft since 2 years ago and having SI thoughts chronically but worse today. States is married and has 47 y/o child and feels bad about having these thoughts. States every night and I say the Lord's prayer and I pray for him to just take me away, not wake up. States thinks about hanging myself in the back yard and states has not done it because I'm a coward. States wife and son unaware of his SI. States everything hit home today.   Only 2 people can call for info: wife Karsin Pesta and aunt Fronie Barrio.

## 2024-05-05 NOTE — ED Notes (Signed)
 Pt just finished speaking with tts

## 2024-05-05 NOTE — BH Assessment (Signed)
 Pt provided with referrals to outpatient mental health and substance abuse treatment resources. Pt also provided with information about 988, the suicide crisis line.

## 2024-05-05 NOTE — BH Assessment (Signed)
 TTS requested [facility] consult via telephone call and secure chat.

## 2024-05-15 NOTE — Progress Notes (Signed)
 ANNUAL EXAM  Danny Downs is a 48 y.o. male  CHIEF COMPLAINT: Chief Complaint  Patient presents with  . Annual Exam    SUBJECTIVE: Patient really trying to get out of debt.  Did have an episode of alcohol about 2 months ago, doing better, no alcohol now, going to [facility].  Feels like the Zoloft makes him more agitated, has trouble sleeping ______________________________________________________________________ A comprehensive ROS was negative in all 10 systems reviewed.  ALLERGIES: Patient has no known allergies.  Past Medical History:  Diagnosis Date  . Anxiety   . Hyperlipidemia, mixed 12/15/2022  . Tobacco use     Past Surgical History:  Procedure Laterality Date  . Colon @ [facility]  11/23/2023   Tubular adenomas/Hyperplastic polyp/Repeat 91yrs/TKT  . EGD @ [facility]  11/23/2023   Reflux esophagitis/Gastritis/Esophageal stenosis/Non-bleeding gastric ulcer/No repeat/TKT  . HERNIA REPAIR      Current Outpatient Medications  Medication Sig Dispense Refill  . ibuprofen (ADVIL,MOTRIN) 200 MG tablet Take 200 mg by mouth every 6 (six) hours as needed for Pain.    . pantoprazole  (PROTONIX ) 40 MG DR tablet Take 1 tablet (40 mg total) by mouth once daily Take 15-20 mins before meal 90 tablet 3  . traZODone  (DESYREL ) 50 MG tablet 1 or 2 pills at bedtime 60 tablet 11   No current facility-administered medications for this visit.    PHYSICAL EXAM: BP 130/88   Pulse 78   Wt 79.3 kg (174 lb 12.8 oz)   SpO2 97%   BMI 30.00 kg/m   Body mass index is 30 kg/m.  Wt Readings from Last 2 Encounters:  05/15/24 79.3 kg (174 lb 12.8 oz)  02/29/24 79.4 kg (175 lb)    BP Readings from Last 2 Encounters:  05/15/24 130/88  02/29/24 (!) 143/90    General. Alert oriented x3  Skin. No suspicious lesions or moles on trunk or abdomen Eyes. Sclera and conjunctiva clear; pupils equal round and reactive to light and accommodation; extraocular movements intact Ears. External normal;  canals clear; tympanic membranes normal Nose. Mucosa healthy without drainage or ulceration Oropharynx. No suspicious lesions Neck. No swelling, masses, stiffness, pain, limited movement, carotid pulses normal bilaterally, thyroid normal size, no masses palpated.  No bruits Breasts. No masses noted Lungs. Respirations unlabored; clear to auscultation bilaterally Back. No spinal deformity Cardiovascular. Heart regular rate and rhythm without murmurs, gallops, or rubs Abdomen. Soft; non tender; non distended; normoactive bowel sounds; no masses or organomegaly Lymph Nodes. No significant cervical, supraclavicular, axillary or inguinal lymphadenopathy noted Musculoskeletal. No deformities; no active joint inflammation Extremities. Normal, no edema Pulses. Dorsalis pedis palpable and symmetric bilaterally Neurologic. Alert and oriented X3; speech intact; face symmetrical; moves all extremities well    ASSESSMENT/PLAN:  Mild anxiety-Zoloft not working, wean off of that, trazodone  50 mg 1-2 pills at bedtime, could consider Remeron, he will let me know how that does History alcohol-off alcohol, doing well, try to get out of debt Reflux-Protonix  [facility], doing really well   Goals Addressed               This Visit's Progress   .  Lose Weight   Not on track     To 145 lbs     . * Patient Goals (pt-stated)   Not on track     Stop vaping and to have less anxiety.    . * Patient Goals (pt-stated)   On track     Less anxiety.    SABRA *  COMPLETED: Patient Goals (pt-stated)   On track     Get another room done in his home.        .phq2  Return in about 1 year (around 05/15/2025) for physical.               *Some images could not be shown.

## 2024-05-20 ENCOUNTER — Ambulatory Visit (HOSPITAL_COMMUNITY)
Admission: EM | Admit: 2024-05-20 | Discharge: 2024-05-21 | Disposition: A | Discharge status: Other Acute Inpatient Hospital | Attending: Nurse Practitioner | Admitting: Nurse Practitioner

## 2024-05-20 DIAGNOSIS — F1010 Alcohol abuse, uncomplicated: Secondary | ICD-10-CM | POA: Insufficient documentation

## 2024-05-20 DIAGNOSIS — F32A Depression, unspecified: Secondary | ICD-10-CM | POA: Diagnosis not present

## 2024-05-20 DIAGNOSIS — F1020 Alcohol dependence, uncomplicated: Secondary | ICD-10-CM | POA: Diagnosis present

## 2024-05-20 LAB — POCT URINE DRUG SCREEN - MANUAL ENTRY (I-SCREEN)
POC Amphetamine UR: NOT DETECTED
POC Buprenorphine (BUP): NOT DETECTED
POC Cocaine UR: NOT DETECTED
POC Marijuana UR: POSITIVE — AB
POC Methadone UR: NOT DETECTED
POC Methamphetamine UR: NOT DETECTED
POC Morphine: NOT DETECTED
POC Oxazepam (BZO): NOT DETECTED
POC Oxycodone UR: NOT DETECTED
POC Secobarbital (BAR): NOT DETECTED

## 2024-05-20 MED ORDER — THIAMINE HCL 100 MG/ML IJ SOLN
100.0000 mg | Freq: Once | INTRAMUSCULAR | Status: AC
  Administered 2024-05-20: 100 mg via INTRAMUSCULAR
  Filled 2024-05-20: qty 2

## 2024-05-20 MED ORDER — DIPHENHYDRAMINE HCL 50 MG/ML IJ SOLN: 50.0000 mg | Freq: Three times a day (TID) | INTRAMUSCULAR | Status: DC | PRN

## 2024-05-20 MED ORDER — ONDANSETRON 4 MG PO TBDP: 4.0000 mg | ORAL_TABLET | Freq: Four times a day (QID) | ORAL | Status: DC | PRN

## 2024-05-20 MED ORDER — LORAZEPAM 1 MG PO TABS
1.0000 mg | ORAL_TABLET | Freq: Four times a day (QID) | ORAL | Status: DC
  Administered 2024-05-20: 1 mg via ORAL
  Filled 2024-05-20: qty 1

## 2024-05-20 MED ORDER — HYDROXYZINE HCL 25 MG PO TABS: 25.0000 mg | ORAL_TABLET | Freq: Three times a day (TID) | ORAL | Status: DC | PRN

## 2024-05-20 MED ORDER — DIPHENHYDRAMINE HCL 50 MG PO CAPS: 50.0000 mg | ORAL_CAPSULE | Freq: Three times a day (TID) | ORAL | Status: DC | PRN

## 2024-05-20 MED ORDER — LORAZEPAM 1 MG PO TABS: 1.0000 mg | ORAL_TABLET | Freq: Every day | ORAL | Status: DC

## 2024-05-20 MED ORDER — LOPERAMIDE HCL 2 MG PO CAPS: 2.0000 mg | ORAL_CAPSULE | ORAL | Status: DC | PRN

## 2024-05-20 MED ORDER — LORAZEPAM 2 MG/ML IJ SOLN: 2.0000 mg | Freq: Three times a day (TID) | INTRAMUSCULAR | Status: DC | PRN

## 2024-05-20 MED ORDER — THIAMINE MONONITRATE 100 MG PO TABS: 100.0000 mg | ORAL_TABLET | Freq: Every day | ORAL | Status: DC

## 2024-05-20 MED ORDER — HALOPERIDOL LACTATE 5 MG/ML IJ SOLN: 5.0000 mg | Freq: Three times a day (TID) | INTRAMUSCULAR | Status: DC | PRN

## 2024-05-20 MED ORDER — ACETAMINOPHEN 325 MG PO TABS: 650.0000 mg | ORAL_TABLET | Freq: Four times a day (QID) | ORAL | Status: DC | PRN

## 2024-05-20 MED ORDER — HALOPERIDOL 5 MG PO TABS: 5.0000 mg | ORAL_TABLET | Freq: Three times a day (TID) | ORAL | Status: DC | PRN

## 2024-05-20 MED ORDER — HALOPERIDOL LACTATE 5 MG/ML IJ SOLN: 10.0000 mg | Freq: Three times a day (TID) | INTRAMUSCULAR | Status: DC | PRN

## 2024-05-20 MED ORDER — TRAZODONE HCL 50 MG PO TABS
50.0000 mg | ORAL_TABLET | Freq: Every evening | ORAL | Status: DC | PRN
  Administered 2024-05-20: 50 mg via ORAL
  Filled 2024-05-20: qty 1

## 2024-05-20 MED ORDER — MAGNESIUM HYDROXIDE 400 MG/5ML PO SUSP: 30.0000 mL | Freq: Every day | ORAL | Status: DC | PRN

## 2024-05-20 MED ORDER — ALUM & MAG HYDROXIDE-SIMETH 200-200-20 MG/5ML PO SUSP: 30.0000 mL | ORAL | Status: DC | PRN

## 2024-05-20 MED ORDER — LORAZEPAM 1 MG PO TABS: 1.0000 mg | ORAL_TABLET | Freq: Three times a day (TID) | ORAL | Status: DC

## 2024-05-20 MED ORDER — LORAZEPAM 1 MG PO TABS: 1.0000 mg | ORAL_TABLET | Freq: Two times a day (BID) | ORAL | Status: DC

## 2024-05-20 MED ORDER — ADULT MULTIVITAMIN W/MINERALS CH: 1.0000 | ORAL_TABLET | Freq: Every day | ORAL | Status: DC

## 2024-05-20 MED ORDER — LORAZEPAM 1 MG PO TABS: 1.0000 mg | ORAL_TABLET | Freq: Four times a day (QID) | ORAL | Status: DC | PRN

## 2024-05-20 MED ORDER — HYDROXYZINE HCL 25 MG PO TABS: 25.0000 mg | ORAL_TABLET | Freq: Four times a day (QID) | ORAL | Status: DC | PRN

## 2024-05-20 NOTE — ED Notes (Addendum)
 Danny Downs arrived to the [facility] for alcohol and suicidal ideations to be transferred to [facility] after labs result. Patient is A&Ox4 . Patient states what brought them into the facility is that  my wife wanted me to come to the realization that I had a problem and that's why I am here . Patient denies suicidal ideations or homicidal ideations . Pt contracts for safety. Patient denies auditory hallucinations, visual hallucinations, or CAH. Skin check conducted by Corean RN and Akeem, MHT. Patient oriented to the unit and provided food, beverage, and snack. Patient verbalized understanding. Patient currently resting in bed. No distress noted.

## 2024-05-21 ENCOUNTER — Encounter (HOSPITAL_COMMUNITY): Payer: Self-pay | Admitting: Nurse Practitioner

## 2024-05-21 ENCOUNTER — Other Ambulatory Visit (HOSPITAL_COMMUNITY)
Admission: EM | Admit: 2024-05-21 | Discharge: 2024-05-24 | Disposition: A | Discharge status: Home / Self Care | Source: Intra-hospital | Attending: Psychiatry | Admitting: Psychiatry

## 2024-05-21 DIAGNOSIS — F10230 Alcohol dependence with withdrawal, uncomplicated: Secondary | ICD-10-CM | POA: Diagnosis not present

## 2024-05-21 DIAGNOSIS — F411 Generalized anxiety disorder: Secondary | ICD-10-CM | POA: Diagnosis not present

## 2024-05-21 DIAGNOSIS — F1994 Other psychoactive substance use, unspecified with psychoactive substance-induced mood disorder: Secondary | ICD-10-CM | POA: Diagnosis not present

## 2024-05-21 DIAGNOSIS — F1010 Alcohol abuse, uncomplicated: Secondary | ICD-10-CM | POA: Diagnosis not present

## 2024-05-21 DIAGNOSIS — F1090 Alcohol use, unspecified, uncomplicated: Secondary | ICD-10-CM | POA: Diagnosis present

## 2024-05-21 DIAGNOSIS — G4700 Insomnia, unspecified: Secondary | ICD-10-CM | POA: Diagnosis not present

## 2024-05-21 DIAGNOSIS — Z79899 Other long term (current) drug therapy: Secondary | ICD-10-CM | POA: Diagnosis not present

## 2024-05-21 LAB — LIPID PANEL
Cholesterol: 366 mg/dL — ABNORMAL HIGH (ref 0–200)
HDL: 81 mg/dL (ref >40)
LDL Cholesterol: 253 mg/dL — ABNORMAL HIGH (ref 0–99)
Total CHOL/HDL Ratio: 4.5 ratio
Triglycerides: 158 mg/dL — ABNORMAL HIGH (ref <150)
VLDL: 32 mg/dL (ref 0–40)

## 2024-05-21 LAB — CBC WITH DIFFERENTIAL/PLATELET
Abs Immature Granulocytes: 0.04 K/uL (ref 0.00–0.07)
Basophils Absolute: 0.1 K/uL (ref 0.0–0.1)
Basophils Relative: 1 %
Eosinophils Absolute: 0 K/uL (ref 0.0–0.5)
Eosinophils Relative: 0 %
HCT: 47.9 % (ref 39.0–52.0)
Hemoglobin: 16.5 g/dL (ref 13.0–17.0)
Immature Granulocytes: 0 %
Lymphocytes Relative: 30 %
Lymphs Abs: 3.4 K/uL (ref 0.7–4.0)
MCH: 30.4 pg (ref 26.0–34.0)
MCHC: 34.4 g/dL (ref 30.0–36.0)
MCV: 88.2 fL (ref 80.0–100.0)
Monocytes Absolute: 0.6 K/uL (ref 0.1–1.0)
Monocytes Relative: 6 %
Neutro Abs: 7.2 K/uL (ref 1.7–7.7)
Neutrophils Relative %: 63 %
Platelets: 361 K/uL (ref 150–400)
RBC: 5.43 MIL/uL (ref 4.22–5.81)
RDW: 12.6 % (ref 11.5–15.5)
WBC: 11.3 K/uL — ABNORMAL HIGH (ref 4.0–10.5)
nRBC: 0 % (ref 0.0–0.2)

## 2024-05-21 LAB — COMPREHENSIVE METABOLIC PANEL WITH GFR
ALT: 54 U/L — ABNORMAL HIGH (ref 0–44)
AST: 68 U/L — ABNORMAL HIGH (ref 15–41)
Albumin: 4.2 g/dL (ref 3.5–5.0)
Alkaline Phosphatase: 57 U/L (ref 38–126)
Anion gap: 17 — ABNORMAL HIGH (ref 5–15)
BUN: 10 mg/dL (ref 6–20)
CO2: 22 mmol/L (ref 22–32)
Calcium: 9.4 mg/dL (ref 8.9–10.3)
Chloride: 106 mmol/L (ref 98–111)
Creatinine, Ser: 0.78 mg/dL (ref 0.61–1.24)
GFR, Estimated: >60 mL/min (ref >60)
Glucose, Bld: 104 mg/dL — ABNORMAL HIGH (ref 70–99)
Potassium: 4.7 mmol/L (ref 3.5–5.1)
Sodium: 145 mmol/L (ref 135–145)
Total Bilirubin: 0.7 mg/dL (ref 0.0–1.2)
Total Protein: 7.5 g/dL (ref 6.5–8.1)

## 2024-05-21 LAB — SARS CORONAVIRUS 2 BY RT PCR: SARS Coronavirus 2 by RT PCR: NEGATIVE

## 2024-05-21 LAB — ETHANOL: Alcohol, Ethyl (B): 312 mg/dL (ref <15)

## 2024-05-21 LAB — TSH: TSH: 0.324 u[IU]/mL — ABNORMAL LOW (ref 0.350–4.500)

## 2024-05-21 LAB — MAGNESIUM: Magnesium: 2.2 mg/dL (ref 1.7–2.4)

## 2024-05-21 MED ORDER — DIPHENHYDRAMINE HCL 50 MG PO CAPS: 50.0000 mg | ORAL_CAPSULE | Freq: Three times a day (TID) | ORAL | Status: DC | PRN

## 2024-05-21 MED ORDER — PANTOPRAZOLE SODIUM 40 MG PO TBEC
40.0000 mg | DELAYED_RELEASE_TABLET | Freq: Every day | ORAL | Status: DC
  Administered 2024-05-21 – 2024-05-24 (×4): 40 mg via ORAL
  Filled 2024-05-21 (×4): qty 1

## 2024-05-21 MED ORDER — ACETAMINOPHEN 325 MG PO TABS: 650.0000 mg | ORAL_TABLET | Freq: Four times a day (QID) | ORAL | Status: DC | PRN

## 2024-05-21 MED ORDER — HALOPERIDOL 5 MG PO TABS: 5.0000 mg | ORAL_TABLET | Freq: Three times a day (TID) | ORAL | Status: DC | PRN

## 2024-05-21 MED ORDER — HYDROXYZINE HCL 25 MG PO TABS: 25.0000 mg | ORAL_TABLET | Freq: Four times a day (QID) | ORAL | Status: DC | PRN

## 2024-05-21 MED ORDER — TRAZODONE HCL 50 MG PO TABS
50.0000 mg | ORAL_TABLET | Freq: Every evening | ORAL | Status: DC | PRN
  Administered 2024-05-21: 50 mg via ORAL
  Filled 2024-05-21: qty 1

## 2024-05-21 MED ORDER — THIAMINE MONONITRATE 100 MG PO TABS
100.0000 mg | ORAL_TABLET | Freq: Every day | ORAL | Status: DC
  Administered 2024-05-22 – 2024-05-24 (×3): 100 mg via ORAL
  Filled 2024-05-21 (×3): qty 1

## 2024-05-21 MED ORDER — ALUM & MAG HYDROXIDE-SIMETH 200-200-20 MG/5ML PO SUSP: 30.0000 mL | ORAL | Status: DC | PRN

## 2024-05-21 MED ORDER — LORAZEPAM 1 MG PO TABS: 1.0000 mg | ORAL_TABLET | Freq: Two times a day (BID) | ORAL | Status: DC

## 2024-05-21 MED ORDER — MAGNESIUM HYDROXIDE 400 MG/5ML PO SUSP: 30.0000 mL | Freq: Every day | ORAL | Status: DC | PRN

## 2024-05-21 MED ORDER — HALOPERIDOL LACTATE 5 MG/ML IJ SOLN: 5.0000 mg | Freq: Three times a day (TID) | INTRAMUSCULAR | Status: DC | PRN

## 2024-05-21 MED ORDER — ADULT MULTIVITAMIN W/MINERALS CH
1.0000 | ORAL_TABLET | Freq: Every day | ORAL | Status: DC
  Administered 2024-05-21 – 2024-05-24 (×4): 1 via ORAL
  Filled 2024-05-21 (×4): qty 1

## 2024-05-21 MED ORDER — ONDANSETRON 4 MG PO TBDP: 4.0000 mg | ORAL_TABLET | Freq: Four times a day (QID) | ORAL | Status: AC | PRN

## 2024-05-21 MED ORDER — LORAZEPAM 1 MG PO TABS: 1.0000 mg | ORAL_TABLET | Freq: Every day | ORAL | Status: DC

## 2024-05-21 MED ORDER — LOPERAMIDE HCL 2 MG PO CAPS: 2.0000 mg | ORAL_CAPSULE | ORAL | Status: AC | PRN

## 2024-05-21 MED ORDER — DIPHENHYDRAMINE HCL 50 MG/ML IJ SOLN: 50.0000 mg | Freq: Three times a day (TID) | INTRAMUSCULAR | Status: DC | PRN

## 2024-05-21 MED ORDER — LORAZEPAM 1 MG PO TABS
1.0000 mg | ORAL_TABLET | Freq: Four times a day (QID) | ORAL | Status: AC
  Administered 2024-05-21 – 2024-05-22 (×6): 1 mg via ORAL
  Filled 2024-05-21 (×6): qty 1

## 2024-05-21 MED ORDER — LORAZEPAM 2 MG/ML IJ SOLN: 2.0000 mg | Freq: Three times a day (TID) | INTRAMUSCULAR | Status: DC | PRN

## 2024-05-21 MED ORDER — LORAZEPAM 1 MG PO TABS: 1.0000 mg | ORAL_TABLET | Freq: Four times a day (QID) | ORAL | Status: AC | PRN

## 2024-05-21 MED ORDER — LORAZEPAM 1 MG PO TABS: 1.0000 mg | ORAL_TABLET | Freq: Three times a day (TID) | ORAL | Status: DC

## 2024-05-21 NOTE — ED Notes (Signed)
 Lab notified nurse of Ethanol level 312. Notified provider Roxianne, NP of lab. No New order.
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
## 2024-05-21 NOTE — ED Notes (Signed)
 Patient resting with eyes closed in no apparent acute distress. Respirations even and unlabored. Environment secured. Safety checks in place according to facility policy.

## 2024-05-21 NOTE — ED Notes (Signed)
 Patient observed/assessed at bedside where pt was sitting on his bed. Patient alert and oriented to self and location. Pt is pleasant , and states he is  a bit anxious because he is away from his wife, according to him, he is always with the wife and they do everything together.Patient denies pain and anxiety. He denies A/V/H. He denies having any thoughts/plan of self harm and harm towards others. Fluid and snack offered. Verbalizes no further complaints at this time.

## 2024-05-21 NOTE — ED Notes (Signed)
 Patient is sleeping. Respirations equal and unlabored. No change in assessment or acuity. Routine safety checks conducted according to facility protocol.

## 2024-05-21 NOTE — Discharge Planning (Signed)
 LCSW met with patient to assess current mood, affect, physical state, and inquire about needs/goals while here in [facility] and after discharge. Patient reports he presented to [facility] for alcohol detox. He stated that he had been drinking for approximately 20 years and never been to a detox prior.  Patient reports he was drinking about a 1/5 of liquor daily and reports no other substances used.   Patient also has a pmhx of MDD and GAD and has been taking mediations though his PCP. Patient lives with his wife whom he reports as supportive. He is employed with an [employer] as a Psychologist, occupational. Patient reports his current goal is to seek outpatient resources for substance use and would like to try to [facility] program upstairs.   Patient denies any prior history of outpatient or inpatient substance abuse treatment. Patient currently denies any SI/HI/AVH and reports mood as calm and cooperative.  SW made patient aware in trying to get him scheduled for this evaluation but sometimes am unable and explained walk in process Monday through Friday. Patient expressed understanding and appreciation of LCSW assistance. No other needs were reported at this time by patient.

## 2024-05-21 NOTE — ED Notes (Signed)
 Patient sitting in dayroom interacting with peers. No acute distress noted. No concerns voiced. Informed patient to notify staff with any needs or assistance. Patient verbalized understanding or agreement. Safety checks in place per facility policy.
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
## 2024-05-21 NOTE — ED Notes (Signed)
 Patient alert & oriented x4. Denies intent to harm self or others when asked. Denies A/VH. Patient denies any physical complaints when asked. No acute distress noted. Support and encouragement provided. Patient observed in bedroom, patient interacts minimally with peers and staff, often isolative to room. No inappropriate behaviors seen or reported. Routine safety checks conducted per facility protocol. Encouraged patient to notify staff if any thoughts of harm towards self or others arise. Patient verbalizes understanding and agreement.

## 2024-05-22 MED ORDER — TRAZODONE HCL 50 MG PO TABS: 50.0000 mg | ORAL_TABLET | Freq: Every day | ORAL | Status: DC

## 2024-05-22 MED ORDER — NICOTINE 21 MG/24HR TD PT24
21.0000 mg | MEDICATED_PATCH | Freq: Every day | TRANSDERMAL | Status: DC
  Administered 2024-05-22 – 2024-05-23 (×2): 21 mg via TRANSDERMAL
  Filled 2024-05-22 (×3): qty 1

## 2024-05-22 MED ORDER — NALTREXONE HCL 50 MG PO TABS
50.0000 mg | ORAL_TABLET | Freq: Every day | ORAL | Status: DC
  Administered 2024-05-22 – 2024-05-24 (×3): 50 mg via ORAL
  Filled 2024-05-22 (×3): qty 1

## 2024-05-22 MED ORDER — VENLAFAXINE HCL ER 75 MG PO CP24
75.0000 mg | ORAL_CAPSULE | Freq: Every day | ORAL | Status: DC
  Administered 2024-05-23 – 2024-05-24 (×2): 75 mg via ORAL
  Filled 2024-05-22 (×2): qty 1

## 2024-05-22 MED ORDER — HYDROXYZINE HCL 25 MG PO TABS
25.0000 mg | ORAL_TABLET | ORAL | Status: DC | PRN
  Administered 2024-05-22: 25 mg via ORAL
  Filled 2024-05-22: qty 1

## 2024-05-22 MED ORDER — TRAZODONE HCL 100 MG PO TABS
100.0000 mg | ORAL_TABLET | Freq: Every day | ORAL | Status: DC
  Administered 2024-05-22 – 2024-05-23 (×2): 100 mg via ORAL
  Filled 2024-05-22 (×2): qty 1

## 2024-05-22 NOTE — ED Notes (Signed)
 Pt sleeping in no acute distress. RR even and unlabored. Environment secured. Will continue to monitor for safety.

## 2024-05-22 NOTE — Discharge Planning (Addendum)
 Per MD, patient is anticipated to be stable for DC Thursday or Friday. SW was able to get an appointment scheduled for SAIOP assessment on Tuesday July 16 @ 1:00 for upstairs outpatient follow up. This information will be provided in patients AVS upon DC. Will continue to follow.

## 2024-05-22 NOTE — ED Notes (Signed)
 Patient is sleeping. Respirations equal and unlabored. No change in assessment or acuity. Routine safety checks conducted according to facility protocol.

## 2024-05-22 NOTE — ED Provider Notes (Signed)
 Behavioral Health Progress Note  Date and Time: 05/22/2024 4:07 PM Name: Danny Downs MRN:  [id number]  Subjective:   Patient notes anxiety but denies depression. States he has been feeling guilty and did make suicidal statements and a gesture twice within the past 3-4 weeks. Reports father in law removed access to the guns and patient states he wants to sell them. States he wants to quit drinking and denies any thoughts of wanting to be dead. Adamant he is not suicidal stating I love my wife and kid and I don't want to lose them. Patient states he is interested in SA-IOP referral as well. States he does not feel hopeless. Patient notes struggling with anxiety throughout his whole life, feeling on edge, restless and having muscle tension and insomnia most days. Reports poor sleep last night.    Wife reports her stepfather removed all of the guns. Reports her husband was sleeping on Sunday and she let him sleep. Husband called her and said he was done and he was going to leave this place and he was done. States she found him laying on the floor at home and found the gun case open. She called EMS and police and they took him voluntarily to the ED. States she went to the magistrate to fill out an IVC. States 2 weeks ago patient was intoxicated as well and was trying to get into the house and get guns and was highly intoxicated. Denies prior suicide attempts.   Diagnosis:  Final diagnoses:  Alcohol dependence with uncomplicated withdrawal (HCC)  Substance induced mood disorder (HCC)    Total Time spent with patient: 30 minutes  Past Psychiatric History: GAD, MDD. Alcoholism  Denies history of suicide attempts Patient reports that he has been prescribed wellbutrin, zanax, paxil and zoloft but they were not effective  Past Medical History: hyperlipidemia, hernia repair, reflux\ Family History: Paternal grandfather-alcoholism, paternal uncle-alcoholism, mother-unspecified mental problems patient  unsure what her diagnosis is   Social History: 48 y/o married lives with his wife and 23 y/o son and works as Camera operator Social History:                         Sleep: Poor  Appetite:  Fair  Current Medications:  Current Facility-Administered Medications  Medication Dose Route Frequency Provider Last Rate Last Admin   acetaminophen  (TYLENOL ) tablet 650 mg  650 mg Oral Q6H PRN Bobbitt, Shalon E, NP       alum & mag hydroxide-simeth (MAALOX/MYLANTA) 200-200-20 MG/5ML suspension 30 mL  30 mL Oral Q4H PRN Bobbitt, Shalon E, NP       haloperidol  (HALDOL ) tablet 5 mg  5 mg Oral TID PRN Bobbitt, Shalon E, NP       And   diphenhydrAMINE  (BENADRYL ) capsule 50 mg  50 mg Oral TID PRN Bobbitt, Shalon E, NP       haloperidol  lactate (HALDOL ) injection 5 mg  5 mg Intramuscular TID PRN Bobbitt, Shalon E, NP       And   diphenhydrAMINE  (BENADRYL ) injection 50 mg  50 mg Intramuscular TID PRN Bobbitt, Shalon E, NP       And   LORazepam  (ATIVAN ) injection 2 mg  2 mg Intramuscular TID PRN Bobbitt, Shalon E, NP       hydrOXYzine  (ATARAX ) tablet 25 mg  25 mg Oral Q6H PRN Bobbitt, Shalon E, NP       loperamide  (IMODIUM ) capsule 2-4 mg  2-4 mg Oral  PRN Bobbitt, Shalon E, NP       LORazepam  (ATIVAN ) tablet 1 mg  1 mg Oral Q6H PRN Bobbitt, Shalon E, NP       magnesium  hydroxide (MILK OF MAGNESIA) suspension 30 mL  30 mL Oral Daily PRN Bobbitt, Shalon E, NP       multivitamin with minerals tablet 1 tablet  1 tablet Oral Daily Bobbitt, Shalon E, NP   1 tablet at 05/22/24 9072   naltrexone  (DEPADE) tablet 50 mg  50 mg Oral Daily Kathlean Cinco, MD       ondansetron  (ZOFRAN -ODT) disintegrating tablet 4 mg  4 mg Oral Q6H PRN Bobbitt, Shalon E, NP       pantoprazole  (PROTONIX ) EC tablet 40 mg  40 mg Oral Daily Gable Odonohue, MD   40 mg at 05/22/24 9072   thiamine  (VITAMIN B1) tablet 100 mg  100 mg Oral Daily Bobbitt, Shalon E, NP   100 mg at 05/22/24 9072   traZODone  (DESYREL ) tablet 50 mg  50 mg  Oral QHS PRN Bobbitt, Shalon E, NP   50 mg at 05/21/24 2132   [START ON 05/23/2024] venlafaxine  XR (EFFEXOR -XR) 24 hr capsule 75 mg  75 mg Oral Q breakfast Parnell Spieler, MD       Current Outpatient Medications  Medication Sig Dispense Refill   pantoprazole  (PROTONIX ) 40 MG tablet Take 40 mg by mouth daily.     sertraline (ZOLOFT) 50 MG tablet Take 50 mg by mouth daily.     traZODone  (DESYREL ) 50 MG tablet Take 50-100 mg by mouth at bedtime.      Labs  Lab Results:  Admission on 05/21/2024  Component Date Value Ref Range Status   SARS Coronavirus 2 by RT PCR 05/21/2024 NEGATIVE  NEGATIVE Final   Performed at [facility], [address], [place], [place] [address]  Admission on 05/20/2024, Discharged on 05/21/2024  Component Date Value Ref Range Status   WBC 05/20/2024 11.3 (H)  4.0 - 10.5 K/uL Final   RBC 05/20/2024 5.43  4.22 - 5.81 MIL/uL Final   Hemoglobin 05/20/2024 16.5  13.0 - 17.0 g/dL Final   HCT 92/93/7974 47.9  39.0 - 52.0 % Final   MCV 05/20/2024 88.2  80.0 - 100.0 fL Final   MCH 05/20/2024 30.4  26.0 - 34.0 pg Final   MCHC 05/20/2024 34.4  30.0 - 36.0 g/dL Final   RDW 92/93/7974 12.6  11.5 - 15.5 % Final   Platelets 05/20/2024 361  150 - 400 K/uL Final   nRBC 05/20/2024 0.0  0.0 - 0.2 % Final   Neutrophils Relative % 05/20/2024 63  % Final   Neutro Abs 05/20/2024 7.2  1.7 - 7.7 K/uL Final   Lymphocytes Relative 05/20/2024 30  % Final   Lymphs Abs 05/20/2024 3.4  0.7 - 4.0 K/uL Final   Monocytes Relative 05/20/2024 6  % Final   Monocytes Absolute 05/20/2024 0.6  0.1 - 1.0 K/uL Final   Eosinophils Relative 05/20/2024 0  % Final   Eosinophils Absolute 05/20/2024 0.0  0.0 - 0.5 K/uL Final   Basophils Relative 05/20/2024 1  % Final   Basophils Absolute 05/20/2024 0.1  0.0 - 0.1 K/uL Final   Immature Granulocytes 05/20/2024 0  % Final   Abs Immature Granulocytes 05/20/2024 0.04  0.00 - 0.07 K/uL Final   Performed at [facility], [address]  [place], [place] [address]   Sodium 05/20/2024 145  135 - 145 mmol/L Final  Potassium 05/20/2024 4.7  3.5 - 5.1 mmol/L Final   Chloride 05/20/2024 106  98 - 111 mmol/L Final   CO2 05/20/2024 22  22 - 32 mmol/L Final   Glucose, Bld 05/20/2024 104 (H)  70 - 99 mg/dL Final   Glucose reference range applies only to samples taken after fasting for at least 8 hours.   BUN 05/20/2024 10  6 - 20 mg/dL Final   Creatinine, Ser 05/20/2024 0.78  0.61 - 1.24 mg/dL Final   Calcium 92/93/7974 9.4  8.9 - 10.3 mg/dL Final   Total Protein 92/93/7974 7.5  6.5 - 8.1 g/dL Final   Albumin 92/93/7974 4.2  3.5 - 5.0 g/dL Final   AST 92/93/7974 68 (H)  15 - 41 U/L Final   ALT 05/20/2024 54 (H)  0 - 44 U/L Final   Alkaline Phosphatase 05/20/2024 57  38 - 126 U/L Final   Total Bilirubin 05/20/2024 0.7  0.0 - 1.2 mg/dL Final   GFR, Estimated 05/20/2024 >60  >60 mL/min Final   Comment: (NOTE) Calculated using the CKD-EPI Creatinine Equation (2021)    Anion gap 05/20/2024 17 (H)  5 - 15 Final   Performed at [facility], [address] [place], [place] [address]   Alcohol, Ethyl (B) 05/20/2024 312 (HH)  <15 mg/dL Final   Comment: CRITICAL RESULT CALLED TO, READ BACK BY AND VERIFIED WITH COREAN BLACKWATER, RN @0030  07.07.25 AALTOM (NOTE) For medical purposes only. Performed at [facility], [address], [place], [place] [address]    Cholesterol 05/20/2024 366 (H)  0 - 200 mg/dL Final   Triglycerides 92/93/7974 158 (H)  <150 mg/dL Final   HDL 92/93/7974 81  >40 mg/dL Final   Total CHOL/HDL Ratio 05/20/2024 4.5  RATIO Final   VLDL 05/20/2024 32  0 - 40 mg/dL Final   LDL Cholesterol 05/20/2024 253 (H)  0 - 99 mg/dL Final   Comment:        Total Cholesterol/HDL:CHD Risk Coronary Heart Disease Risk Table                     Men   Women  1/2 Average Risk   3.4   3.3  Average Risk       5.0   4.4  2 X Average Risk   9.6   7.1  3 X Average Risk  23.4   11.0        Use the calculated Patient  Ratio above and the CHD Risk Table to determine the patient's CHD Risk.        ATP III CLASSIFICATION (LDL):  <100     mg/dL   Optimal  899-870  mg/dL   Near or Above                    Optimal  130-159  mg/dL   Borderline  839-810  mg/dL   High  >809     mg/dL   Very High Performed at [facility], [address], [place], [place] [address]    POC Amphetamine UR 05/20/2024 None Detected  NONE DETECTED (Cut Off Level 1000 ng/mL) Final   POC Secobarbital (BAR) 05/20/2024 None Detected  NONE DETECTED (Cut Off Level 300 ng/mL) Final   POC Buprenorphine (BUP) 05/20/2024 None Detected  NONE DETECTED (Cut Off Level 10 ng/mL) Final   POC Oxazepam (BZO) 05/20/2024 None Detected  NONE DETECTED (Cut Off Level 300 ng/mL) Final  POC Cocaine UR 05/20/2024 None Detected  NONE DETECTED (Cut Off Level 300 ng/mL) Final   POC Methamphetamine UR 05/20/2024 None Detected  NONE DETECTED (Cut Off Level 1000 ng/mL) Final   POC Morphine  05/20/2024 None Detected  NONE DETECTED (Cut Off Level 300 ng/mL) Final   POC Methadone UR 05/20/2024 None Detected  NONE DETECTED (Cut Off Level 300 ng/mL) Final   POC Oxycodone UR 05/20/2024 None Detected  NONE DETECTED (Cut Off Level 100 ng/mL) Final   POC Marijuana UR 05/20/2024 Positive (A)  NONE DETECTED (Cut Off Level 50 ng/mL) Final   Magnesium  05/20/2024 2.2  1.7 - 2.4 mg/dL Final   Performed at [facility], [address], [place], [place] [address]   TSH 05/20/2024 0.324 (L)  0.350 - 4.500 uIU/mL Final   Comment: Performed by a 3rd Generation assay with a functional sensitivity of <=0.01 uIU/mL. Performed at [facility], [address] [place], [place] [address]   Admission on 05/05/2024, Discharged on 05/05/2024  Component Date Value Ref Range Status   Sodium 05/05/2024 142  135 - 145 mmol/L Final   Potassium 05/05/2024 3.5  3.5 - 5.1 mmol/L Final   Chloride 05/05/2024 109  98 - 111 mmol/L Final   CO2 05/05/2024 26  22 - 32 mmol/L Final    Glucose, Bld 05/05/2024 106 (H)  70 - 99 mg/dL Final   Glucose reference range applies only to samples taken after fasting for at least 8 hours.   BUN 05/05/2024 11  6 - 20 mg/dL Final   Creatinine, Ser 05/05/2024 0.92  0.61 - 1.24 mg/dL Final   Calcium 93/78/7974 8.7 (L)  8.9 - 10.3 mg/dL Final   Total Protein 93/78/7974 7.4  6.5 - 8.1 g/dL Final   Albumin 93/78/7974 4.2  3.5 - 5.0 g/dL Final   AST 93/78/7974 36  15 - 41 U/L Final   ALT 05/05/2024 38  0 - 44 U/L Final   Alkaline Phosphatase 05/05/2024 50  38 - 126 U/L Final   Total Bilirubin 05/05/2024 0.6  0.0 - 1.2 mg/dL Final   GFR, Estimated 05/05/2024 >60  >60 mL/min Final   Comment: (NOTE) Calculated using the CKD-EPI Creatinine Equation (2021)    Anion gap 05/05/2024 7  5 - 15 Final   Performed at [facility], [address] [place], [place] [address]   Alcohol, Ethyl (B) 05/05/2024 303 (HH)  <15 mg/dL Final   Comment: CRITICAL RESULT CALLED TO, READ BACK BY AND VERIFIED WITH JESSICA STALEY @1738  05/05/24 MJU (NOTE) For medical purposes only. Performed at [facility], [address] [place], [place] [address]    WBC 05/05/2024 8.6  4.0 - 10.5 K/uL Final   RBC 05/05/2024 5.18  4.22 - 5.81 MIL/uL Final   Hemoglobin 05/05/2024 15.5  13.0 - 17.0 g/dL Final   HCT 93/78/7974 44.9  39.0 - 52.0 % Final   MCV 05/05/2024 86.7  80.0 - 100.0 fL Final   MCH 05/05/2024 29.9  26.0 - 34.0 pg Final   MCHC 05/05/2024 34.5  30.0 - 36.0 g/dL Final   RDW 93/78/7974 12.1  11.5 - 15.5 % Final   Platelets 05/05/2024 349  150 - 400 K/uL Final   nRBC 05/05/2024 0.0  0.0 - 0.2 % Final   Performed at [facility], [address] [place], [place] [address]   Salicylate Lvl 05/05/2024 <7.0 (L)  7.0 - 30.0 mg/dL Final   Performed at [facility], [address] [place], [place] [address]   Acetaminophen  (Tylenol ), Serum 05/05/2024 <10 (L)  10 - 30 ug/mL Final   Comment: (NOTE) Therapeutic concentrations  vary significantly. A range of 10-30 ug/mL  may be an effective concentration for many patients. However, some  are best treated at concentrations outside of this range. Acetaminophen  concentrations >150 ug/mL at 4 hours after ingestion  and >50 ug/mL at 12 hours after ingestion are often associated with  toxic reactions.  Performed at [facility], [address] [place], [place] [address]     Blood Alcohol level:  Lab Results  Component Value Date   ETH 312 [facility]) 05/20/2024   ETH 303 (HH) 05/05/2024    Metabolic Disorder Labs: No results found for: HGBA1C, MPG No results found for: PROLACTIN Lab Results  Component Value Date   CHOL 366 (H) 05/20/2024   TRIG 158 (H) 05/20/2024   HDL 81 05/20/2024   CHOLHDL 4.5 05/20/2024   VLDL 32 05/20/2024   LDLCALC 253 (H) 05/20/2024    Therapeutic Lab Levels: No results found for: LITHIUM No results found for: VALPROATE No results found for: CBMZ  Physical Findings   Flowsheet Row ED from 05/21/2024 in [facility] ED from 05/20/2024 in [facility] ED from 05/05/2024 in [facility] Emergency Department at [facility]  C-SSRS RISK CATEGORY No Risk Error: Q3, 4, or 5 should not be populated when Q2 is No Moderate Risk     Musculoskeletal  Strength & Muscle Tone: within normal limits Gait & Station: normal Patient leans: N/A  Psychiatric Specialty Exam  Presentation  General Appearance:  Appropriate for Environment  Eye Contact: Fair  Speech: Clear and Coherent  Speech Volume: Normal  Handedness: Right   Mood and Affect  Mood: Depressed; Anxious  Affect: Congruent   Thought Process  Thought Processes: Coherent  Descriptions of Associations:Intact  Orientation:Full (Time, Place and Person)  Thought Content:Logical     Hallucinations:Hallucinations: None  Ideas of Reference:None  Suicidal Thoughts:Suicidal Thoughts:  No  Homicidal Thoughts:Homicidal Thoughts: No   Sensorium  Memory: Immediate Fair  Judgment: Fair  Insight: Fair   Art therapist  Concentration: Fair  Attention Span: Fair  Recall: Fair  Fund of Knowledge: Fair  Language: Fair   Psychomotor Activity  Psychomotor Activity: Psychomotor Activity: Normal   Assets  Assets: Desire for Improvement; Financial Resources/Insurance; Leisure Time; Physical Health; Resilience; Communication Skills; Social Support   Sleep  Sleep: Sleep: Fair  Estimated Sleeping Duration (Last 24 Hours): 11.75-12.25 hours  No data recorded  Physical Exam  Physical exam:  General: Well developed, well nourished, Caucasian male Pupils: Normal at 3mm Respiratory: Breathing is unlabored.  Cardiovascular: No edema.  Language: No anomia, no aphasia Muscle strength and tone-pt moving all extremities.  Gait not assessed as pt remained in bed.  Neuro: Facial muscles are symmetric. Pt without tremor, no evidence of hyperarousal.   Review of Systems  Constitutional:  Positive for fatigue.  HENT: Negative.    Eyes: Negative.   Cardiovascular: Negative.   Gastrointestinal: Negative.   Genitourinary: Negative.   Musculoskeletal: Negative.   Skin: Negative.   Neurological:  Positive for difficulty with concentration.  Endo/Heme/Allergies: Negative.   Psychiatric/Behavioral:  Positive for depression. The patient is nervous/anxious and has insomnia.   Blood pressure (!) 145/95, pulse 93, temperature 97.8 F (36.6 C), temperature source Oral, resp. rate 17, SpO2 100%. There is no height or weight on file to calculate BMI.  Treatment  Plan Summary:  49 y/o male with a history of MDD, GAD and alcohol abuse  presenting to [facility] as a walk in accompanied by [employer] with complaints of suicidal ideation and alcohol abuse. Patient stated that Pt stated  I'm just done with life when asked what brought him to [facility].    7/7:  Patient states  that when he was sober he did not have depression but has had anxiety his whole life consistent with GAD. States he does not wish to try SSRI or SNRI at this time as zoloft made me suicidal. Discussed potential to try Effexor  or Cymbalta but patient declines. Denies SI, HI or AVH. Patient attributes depressive symptoms to drinking alcohol and states he wants to quit. Denies prior suicide attempts but questionable suicidal gesture of looking for his guns leading up to this. Will need to contact wife for collateral.  7/8: patients wife reports 2 suicidal gestures with patient trying to access guns twice the past month. Patient was heavily intoxicated both times and the guns are now removed. Patient adamantly denies SI, HI or AVH and wants family to sell the guns. States he is motivated to maintain sobriety and start SA-IOP. Patient was tearful and having feelings of guilt. NO prior suicide attempts but 2 suicidal gestures with patient accessing guns the past month. Has continued to relapse on alcohol thus at this time at acute risk for suicide. Notes he would not kill himself however.    #substance induced mood disorder -Patient and wife state he was not depressed when he was sober for >2 months 10 years ago but  -start Effexor  XR 75 mg qdaily off label  #GAD -start Effexor  XR 75 mg qdaily starting tomorrow -start trazodone  50 mg qhs - agreeable to hydroxyzine  25 mg q6h- PRN   ##alcohol use disorder with withdrawal Patient requesting Ativan  taper be discontinued; will start on CIWA + PRN ATivan  - start naltrexone  50 mg qdaily after discussing r/b/a, declines acamprosate or disulfiram  #nicotine  dependence -start nicotine  patch 21 mg qdaily as patient state he vapes daily  Khiley Lieser, MD 05/22/2024 4:07 PM

## 2024-05-22 NOTE — ED Notes (Signed)
 Pt sitting in dayroom watching television and interacting with peers. No acute distress noted. No concerns voiced. Informed pt to notify staff with any needs or assistance. Pt verbalized understanding and agreement. Will continue to monitor for safety.

## 2024-05-22 NOTE — Group Note (Signed)
 Group Topic: Identity and Relationships  Group Date: 05/22/2024 Start Time: 1715 End Time: 1750 Facilitators: Daved Tinnie HERO, RN  Department: [facility]  Number of Participants: 8  Group Focus: nursing group Treatment Modality:  Psychoeducation Interventions utilized were exploration Purpose: increase insight  Name: Danny Downs Date of Birth: 04-05-1976  MR: [id number]    Level of Participation: active Quality of Participation: attentive Interactions with others: gave feedback Mood/Affect: appropriate Triggers (if applicable): n/a Cognition: insightful Progress: Significant Response: pt says he will help to establish healthy relationships by valuing his partner and child Plan: patient will be encouraged to attend future nursing groups  Patients Problems:  Patient Active Problem List   Diagnosis Date Noted   Alcohol use disorder 05/21/2024

## 2024-05-22 NOTE — Discharge Instructions (Signed)
 You are scheduled for your follow up SAIOP assessment upstairs on the second floor on Monday, July 16 @ 1:00.    Non-Emergent / Urgent   [facility] [address] SECOND FLOOR [place], [place] [address] [phone] OUTPATIENT Walk-in information: Please note, all walk-ins are first come & first serve, with limited number of availability.     Please note that to be eligible for services you must bring: ID or a piece of mail with your name [facility] address   Therapist for therapy:  Monday & Wednesdays: Please ARRIVE at 7:15 AM for registration Will START at 8:00 AM Every 1st & 2nd Friday of the month: Please ARRIVE at 10:15 AM for registration Will START at 1 PM - 5 PM   Psychiatrist for medication management: Monday - Friday:  Please ARRIVE at 7:15 AM for registration Will START at 8:00 AM

## 2024-05-22 NOTE — ED Notes (Signed)
 Patient A&Ox4. Denies intent to harm self/others when asked. Denies A/VH. Patient denies any physical complaints when asked. No acute distress noted. Pt states, I'm just missing my wife. Support and encouragement provided. Routine safety checks conducted according to facility protocol. Encouraged patient to notify staff if thoughts of harm toward self or others arise. Patient verbalize understanding and agreement. Will continue to monitor for safety.

## 2024-05-23 NOTE — ED Provider Notes (Signed)
 Behavioral Health Progress Note  Date and Time: 05/23/2024 11:49 AM Name: Danny Downs MRN:  [id number]  Collateral from wife on 05/22/24: Wife reports her stepfather removed all of the guns. Reports her husband was sleeping on Sunday and she let him sleep. Husband called her and said he was done and he was going to leave this place and he was done. States she found him laying on the floor at home and found the gun case open. She called EMS and police and they took him voluntarily to the ED. States she went to the magistrate to fill out an IVC. States 2 weeks ago patient was intoxicated as well and was trying to get into the house and get guns and was highly intoxicated. Denies prior suicide attempts.   Subjective:   Patient reports improved mood today and denies depression and anxiety. He is no longer tearful. Denies feeling hopeless. Denies SI, HI or AVH. Patient denies wanting to be dead. Notes some difficulty sleeping due to other patient snoring. Declines increase of trazodone . We discussed him taking hydroxyzine  if he is feeling anxious today but he denies anxiety at this time. Patient states he wants to live for his son and family and states I would never kill myself. Patient tolerating Effexor  and naltrexone  and denies any side effects. Both patient and wife report his father in law removed access to the guns and patient states he wants to sell them. States he wants to quit drinking and states I will never drink again. I discussed AA meeting and SA-IOP and patient has been set up with with an appointment.  Adamant he is not suicidal stating I love my wife and kid and I don't want to lose them.  Patient denies any symptoms of alcohol withdrawal today after Ativan  taper was discontinued and denies cravings for alcohol. States he is motivated to quit drinking because of the 2 prior incidents this past month and states this was a huge wakeup call.   Patient notes struggling with anxiety  throughout his whole life, feeling on edge, restless and having muscle tension and insomnia most days. Reports he does not feel this way today. I discussed therapy referral as well with patient.   Diagnosis:  Final diagnoses:  Alcohol dependence with uncomplicated withdrawal (HCC)  Substance induced mood disorder (HCC)  GAD (generalized anxiety disorder)    Total Time spent with patient: 30 minutes  Past Psychiatric History: GAD, MDD. Alcoholism  Denies history of suicide attempts Patient reports that he has been prescribed wellbutrin, zanax, paxil and zoloft but they were not effective  Past Medical History: hyperlipidemia, hernia repair, reflux\ Family History: Paternal grandfather-alcoholism, paternal uncle-alcoholism, mother-unspecified mental problems patient unsure what her diagnosis is   Social History: 48 y/o married lives with his wife and 17 y/o son and works as Camera operator Social History:                         Sleep: Poor  Appetite:  Fair  Current Medications:  Current Facility-Administered Medications  Medication Dose Route Frequency Provider Last Rate Last Admin   acetaminophen  (TYLENOL ) tablet 650 mg  650 mg Oral Q6H PRN Bobbitt, Shalon E, NP       alum & mag hydroxide-simeth (MAALOX/MYLANTA) 200-200-20 MG/5ML suspension 30 mL  30 mL Oral Q4H PRN Bobbitt, Shalon E, NP       haloperidol  (HALDOL ) tablet 5 mg  5 mg Oral TID PRN Bobbitt, Shalon E,  NP       And   diphenhydrAMINE  (BENADRYL ) capsule 50 mg  50 mg Oral TID PRN Bobbitt, Shalon E, NP       haloperidol  lactate (HALDOL ) injection 5 mg  5 mg Intramuscular TID PRN Bobbitt, Shalon E, NP       And   diphenhydrAMINE  (BENADRYL ) injection 50 mg  50 mg Intramuscular TID PRN Bobbitt, Shalon E, NP       And   LORazepam  (ATIVAN ) injection 2 mg  2 mg Intramuscular TID PRN Bobbitt, Shalon E, NP       hydrOXYzine  (ATARAX ) tablet 25 mg  25 mg Oral Q4H PRN Claris Guymon, MD   25 mg at 05/22/24 1742    loperamide  (IMODIUM ) capsule 2-4 mg  2-4 mg Oral PRN Bobbitt, Shalon E, NP       LORazepam  (ATIVAN ) tablet 1 mg  1 mg Oral Q6H PRN Bobbitt, Shalon E, NP       magnesium  hydroxide (MILK OF MAGNESIA) suspension 30 mL  30 mL Oral Daily PRN Bobbitt, Shalon E, NP       multivitamin with minerals tablet 1 tablet  1 tablet Oral Daily Bobbitt, Shalon E, NP   1 tablet at 05/23/24 0835   naltrexone  (DEPADE) tablet 50 mg  50 mg Oral Daily Josanna Hefel, MD   50 mg at 05/23/24 0836   nicotine  (NICODERM CQ  - dosed in mg/24 hours) patch 21 mg  21 mg Transdermal Daily Jarrell Armond, MD   21 mg at 05/23/24 0835   ondansetron  (ZOFRAN -ODT) disintegrating tablet 4 mg  4 mg Oral Q6H PRN Bobbitt, Shalon E, NP       pantoprazole  (PROTONIX ) EC tablet 40 mg  40 mg Oral Daily Inger Wiest, MD   40 mg at 05/23/24 0835   thiamine  (VITAMIN B1) tablet 100 mg  100 mg Oral Daily Bobbitt, Shalon E, NP   100 mg at 05/23/24 0835   traZODone  (DESYREL ) tablet 100 mg  100 mg Oral QHS Johnell Bas, MD   100 mg at 05/22/24 2113   venlafaxine  XR (EFFEXOR -XR) 24 hr capsule 75 mg  75 mg Oral Q breakfast Hilbert Briggs, MD   75 mg at 05/23/24 9164   Current Outpatient Medications  Medication Sig Dispense Refill   pantoprazole  (PROTONIX ) 40 MG tablet Take 40 mg by mouth daily.     sertraline (ZOLOFT) 50 MG tablet Take 50 mg by mouth daily.     traZODone  (DESYREL ) 50 MG tablet Take 50-100 mg by mouth at bedtime.      Labs  Lab Results:  Admission on 05/21/2024  Component Date Value Ref Range Status   SARS Coronavirus 2 by RT PCR 05/21/2024 NEGATIVE  NEGATIVE Final   Performed at [facility], [address] [place], [place] [address]  Admission on 05/20/2024, Discharged on 05/21/2024  Component Date Value Ref Range Status   WBC 05/20/2024 11.3 (H)  4.0 - 10.5 K/uL Final   RBC 05/20/2024 5.43  4.22 - 5.81 MIL/uL Final   Hemoglobin 05/20/2024 16.5  13.0 - 17.0 g/dL Final   HCT 92/93/7974 47.9  39.0 - 52.0 % Final   MCV  05/20/2024 88.2  80.0 - 100.0 fL Final   MCH 05/20/2024 30.4  26.0 - 34.0 pg Final   MCHC 05/20/2024 34.4  30.0 - 36.0 g/dL Final   RDW 92/93/7974 12.6  11.5 - 15.5 % Final   Platelets 05/20/2024 361  150 - 400 K/uL Final   nRBC 05/20/2024  0.0  0.0 - 0.2 % Final   Neutrophils Relative % 05/20/2024 63  % Final   Neutro Abs 05/20/2024 7.2  1.7 - 7.7 K/uL Final   Lymphocytes Relative 05/20/2024 30  % Final   Lymphs Abs 05/20/2024 3.4  0.7 - 4.0 K/uL Final   Monocytes Relative 05/20/2024 6  % Final   Monocytes Absolute 05/20/2024 0.6  0.1 - 1.0 K/uL Final   Eosinophils Relative 05/20/2024 0  % Final   Eosinophils Absolute 05/20/2024 0.0  0.0 - 0.5 K/uL Final   Basophils Relative 05/20/2024 1  % Final   Basophils Absolute 05/20/2024 0.1  0.0 - 0.1 K/uL Final   Immature Granulocytes 05/20/2024 0  % Final   Abs Immature Granulocytes 05/20/2024 0.04  0.00 - 0.07 K/uL Final   Performed at [facility], [address], [place], [place] [address]   Sodium 05/20/2024 145  135 - 145 mmol/L Final   Potassium 05/20/2024 4.7  3.5 - 5.1 mmol/L Final   Chloride 05/20/2024 106  98 - 111 mmol/L Final   CO2 05/20/2024 22  22 - 32 mmol/L Final   Glucose, Bld 05/20/2024 104 (H)  70 - 99 mg/dL Final   Glucose reference range applies only to samples taken after fasting for at least 8 hours.   BUN 05/20/2024 10  6 - 20 mg/dL Final   Creatinine, Ser 05/20/2024 0.78  0.61 - 1.24 mg/dL Final   Calcium 92/93/7974 9.4  8.9 - 10.3 mg/dL Final   Total Protein 92/93/7974 7.5  6.5 - 8.1 g/dL Final   Albumin 92/93/7974 4.2  3.5 - 5.0 g/dL Final   AST 92/93/7974 68 (H)  15 - 41 U/L Final   ALT 05/20/2024 54 (H)  0 - 44 U/L Final   Alkaline Phosphatase 05/20/2024 57  38 - 126 U/L Final   Total Bilirubin 05/20/2024 0.7  0.0 - 1.2 mg/dL Final   GFR, Estimated 05/20/2024 >60  >60 mL/min Final   Comment: (NOTE) Calculated using the CKD-EPI Creatinine Equation (2021)    Anion gap 05/20/2024 17 (H)  5 - 15 Final    Performed at [facility], [address], [place], [place] [address]   Alcohol, Ethyl (B) 05/20/2024 312 (HH)  <15 mg/dL Final   Comment: CRITICAL RESULT CALLED TO, READ BACK BY AND VERIFIED WITH COREAN BLACKWATER, RN @0030  07.07.25 AALTOM (NOTE) For medical purposes only. Performed at [facility], [address], [place], [place] [address]    Cholesterol 05/20/2024 366 (H)  0 - 200 mg/dL Final   Triglycerides 92/93/7974 158 (H)  <150 mg/dL Final   HDL 92/93/7974 81  >40 mg/dL Final   Total CHOL/HDL Ratio 05/20/2024 4.5  RATIO Final   VLDL 05/20/2024 32  0 - 40 mg/dL Final   LDL Cholesterol 05/20/2024 253 (H)  0 - 99 mg/dL Final   Comment:        Total Cholesterol/HDL:CHD Risk Coronary Heart Disease Risk Table                     Men   Women  1/2 Average Risk   3.4   3.3  Average Risk       5.0   4.4  2 X Average Risk   9.6   7.1  3 X Average Risk  23.4   11.0        Use the calculated Patient Ratio above and the CHD Risk Table to determine  the patient's CHD Risk.        ATP III CLASSIFICATION (LDL):  <100     mg/dL   Optimal  899-870  mg/dL   Near or Above                    Optimal  130-159  mg/dL   Borderline  839-810  mg/dL   High  >809     mg/dL   Very High Performed at [facility], [address] [place], [place] [address]    POC Amphetamine UR 05/20/2024 None Detected  NONE DETECTED (Cut Off Level 1000 ng/mL) Final   POC Secobarbital (BAR) 05/20/2024 None Detected  NONE DETECTED (Cut Off Level 300 ng/mL) Final   POC Buprenorphine (BUP) 05/20/2024 None Detected  NONE DETECTED (Cut Off Level 10 ng/mL) Final   POC Oxazepam (BZO) 05/20/2024 None Detected  NONE DETECTED (Cut Off Level 300 ng/mL) Final   POC Cocaine UR 05/20/2024 None Detected  NONE DETECTED (Cut Off Level 300 ng/mL) Final   POC Methamphetamine UR 05/20/2024 None Detected  NONE DETECTED (Cut Off Level 1000 ng/mL) Final   POC Morphine  05/20/2024 None Detected  NONE DETECTED (Cut Off  Level 300 ng/mL) Final   POC Methadone UR 05/20/2024 None Detected  NONE DETECTED (Cut Off Level 300 ng/mL) Final   POC Oxycodone UR 05/20/2024 None Detected  NONE DETECTED (Cut Off Level 100 ng/mL) Final   POC Marijuana UR 05/20/2024 Positive (A)  NONE DETECTED (Cut Off Level 50 ng/mL) Final   Magnesium  05/20/2024 2.2  1.7 - 2.4 mg/dL Final   Performed at [facility], [address] [place], [place] [address]   TSH 05/20/2024 0.324 (L)  0.350 - 4.500 uIU/mL Final   Comment: Performed by a 3rd Generation assay with a functional sensitivity of <=0.01 uIU/mL. Performed at [facility], [address], [place], [place] [address]   Admission on 05/05/2024, Discharged on 05/05/2024  Component Date Value Ref Range Status   Sodium 05/05/2024 142  135 - 145 mmol/L Final   Potassium 05/05/2024 3.5  3.5 - 5.1 mmol/L Final   Chloride 05/05/2024 109  98 - 111 mmol/L Final   CO2 05/05/2024 26  22 - 32 mmol/L Final   Glucose, Bld 05/05/2024 106 (H)  70 - 99 mg/dL Final   Glucose reference range applies only to samples taken after fasting for at least 8 hours.   BUN 05/05/2024 11  6 - 20 mg/dL Final   Creatinine, Ser 05/05/2024 0.92  0.61 - 1.24 mg/dL Final   Calcium 93/78/7974 8.7 (L)  8.9 - 10.3 mg/dL Final   Total Protein 93/78/7974 7.4  6.5 - 8.1 g/dL Final   Albumin 93/78/7974 4.2  3.5 - 5.0 g/dL Final   AST 93/78/7974 36  15 - 41 U/L Final   ALT 05/05/2024 38  0 - 44 U/L Final   Alkaline Phosphatase 05/05/2024 50  38 - 126 U/L Final   Total Bilirubin 05/05/2024 0.6  0.0 - 1.2 mg/dL Final   GFR, Estimated 05/05/2024 >60  >60 mL/min Final   Comment: (NOTE) Calculated using the CKD-EPI Creatinine Equation (2021)    Anion gap 05/05/2024 7  5 - 15 Final   Performed at [facility], [address] [place], [place] [address]   Alcohol, Ethyl (B) 05/05/2024 303 (HH)  <15 mg/dL Final   Comment: CRITICAL RESULT CALLED TO, READ BACK BY AND VERIFIED WITH JESSICA STALEY @1738   05/05/24  MJU (NOTE) For medical purposes only. Performed at [facility], [address] [place], [place] [address]    WBC 05/05/2024 8.6  4.0 - 10.5 K/uL Final   RBC 05/05/2024 5.18  4.22 - 5.81 MIL/uL Final   Hemoglobin 05/05/2024 15.5  13.0 - 17.0 g/dL Final   HCT 93/78/7974 44.9  39.0 - 52.0 % Final   MCV 05/05/2024 86.7  80.0 - 100.0 fL Final   MCH 05/05/2024 29.9  26.0 - 34.0 pg Final   MCHC 05/05/2024 34.5  30.0 - 36.0 g/dL Final   RDW 93/78/7974 12.1  11.5 - 15.5 % Final   Platelets 05/05/2024 349  150 - 400 K/uL Final   nRBC 05/05/2024 0.0  0.0 - 0.2 % Final   Performed at [facility], [address] [place], [place] [address]   Salicylate Lvl 05/05/2024 <7.0 (L)  7.0 - 30.0 mg/dL Final   Performed at [facility], [address] [place], [place] [address]   Acetaminophen  (Tylenol ), Serum 05/05/2024 <10 (L)  10 - 30 ug/mL Final   Comment: (NOTE) Therapeutic concentrations vary significantly. A range of 10-30 ug/mL  may be an effective concentration for many patients. However, some  are best treated at concentrations outside of this range. Acetaminophen  concentrations >150 ug/mL at 4 hours after ingestion  and >50 ug/mL at 12 hours after ingestion are often associated with  toxic reactions.  Performed at [facility], [address] [place], [place] [address]     Blood Alcohol level:  Lab Results  Component Value Date   ETH 312 [facility]) 05/20/2024   ETH 303 (HH) 05/05/2024    Metabolic Disorder Labs: No results found for: HGBA1C, MPG No results found for: PROLACTIN Lab Results  Component Value Date   CHOL 366 (H) 05/20/2024   TRIG 158 (H) 05/20/2024   HDL 81 05/20/2024   CHOLHDL 4.5 05/20/2024   VLDL 32 05/20/2024   LDLCALC 253 (H) 05/20/2024    Therapeutic Lab Levels: No results found for: LITHIUM No results found for: VALPROATE No results found for: CBMZ  Physical Findings   PHQ2-9    Flowsheet  Row ED from 05/21/2024 in [facility]  PHQ-2 Total Score 0   Flowsheet Row ED from 05/21/2024 in [facility] ED from 05/20/2024 in [facility] ED from 05/05/2024 in [facility] Emergency Department at [facility]  C-SSRS RISK CATEGORY No Risk Error: Q3, 4, or 5 should not be populated when Q2 is No Moderate Risk     Musculoskeletal  Strength & Muscle Tone: within normal limits Gait & Station: normal Patient leans: N/A  Psychiatric Specialty Exam  Presentation  General Appearance:  Appropriate for Environment  Eye Contact: Fair  Speech: Clear and Coherent  Speech Volume: Normal  Handedness: Right   Mood and Affect  Mood: Euthymic  Affect: Appropriate   Thought Process  Thought Processes: Goal Directed  Descriptions of Associations:Intact  Orientation:Full (Time, Place and Person)  Thought Content:Logical     Hallucinations:Hallucinations: None  Ideas of Reference:None  Suicidal Thoughts:Suicidal Thoughts: No  Homicidal Thoughts:Homicidal Thoughts: No   Sensorium  Memory: Immediate Fair  Judgment: Fair  Insight: Fair   Art therapist  Concentration: Fair  Attention Span: Fair  Recall: [employer] of Knowledge: Fair  Language: Fair   Psychomotor Activity  Psychomotor Activity: Psychomotor Activity: Normal   Assets  Assets: Communication Skills; Desire for Improvement; Social Support; Investment banker, corporate;  Intimacy; Leisure Time; Housing; Health and safety inspector   Sleep  Sleep: Sleep: Fair  Estimated Sleeping Duration (Last 24 Hours): 8.50 hours  No data recorded  Physical Exam  Physical exam:  General: Well developed, well nourished, Caucasian male Pupils: Normal at 3mm Respiratory: Breathing is unlabored.  Cardiovascular: No edema.  Language: No anomia, no aphasia Muscle strength and tone-pt  moving all extremities.  Gait not assessed as pt remained in bed.  Neuro: Facial muscles are symmetric. Pt without tremor, no evidence of hyperarousal.   Review of Systems  Constitutional:  Positive for fatigue.  HENT: Negative.    Eyes: Negative.   Cardiovascular: Negative.   Gastrointestinal: Negative.   Genitourinary: Negative.   Musculoskeletal: Negative.   Skin: Negative.   Neurological:  Positive for difficulty with concentration.  Endo/Heme/Allergies: Negative.   Psychiatric/Behavioral:  Positive for depression. The patient is nervous/anxious and has insomnia.   Blood pressure 130/80, pulse 67, temperature 97.7 F (36.5 C), temperature source Oral, resp. rate 16, SpO2 96%. There is no height or weight on file to calculate BMI.  Treatment Plan Summary:  48 y/o male with a history of MDD, GAD and alcohol abuse  presenting to [facility] as a walk in accompanied by GPD with complaints of suicidal ideation and alcohol abuse. Patient stated that Pt stated  I'm just done with life when asked what brought him to [facility].    7/7:  Patient states that when he was sober he did not have depression but has had anxiety his whole life consistent with GAD. States he does not wish to try SSRI or SNRI at this time as zoloft made me suicidal. Discussed potential to try Effexor  or Cymbalta but patient declines. Denies SI, HI or AVH. Patient attributes depressive symptoms to drinking alcohol and states he wants to quit. Denies prior suicide attempts but questionable suicidal gesture of looking for his guns leading up to this. Will need to contact wife for collateral.  7/8: patients wife reports 2 suicidal gestures with patient trying to access guns twice the past month. Patient was heavily intoxicated both times and the guns are now removed. Patient adamantly denies SI, HI or AVH and wants family to sell the guns. States he is motivated to maintain sobriety and start SA-IOP. Patient was tearful and having  feelings of guilt. NO prior suicide attempts but 2 suicidal gestures with patient accessing guns the past month. Has continued to relapse on alcohol thus at this time at acute risk for suicide. Notes he would not kill himself however.   7/9: patient with improving mood and affect today. Tolerating Effexor , naltrexone  and trazodone . Denies side effects. Denies SI, HI, AVH or passive thoughts of death. Euthymic today. If patient continues to do well will plan for discharge tomorrow. Encouraged him to try hydroxyzine  again if he has any residual anxiety. Patient declines gabapentin stating he is not feeling anxious or experiencing withdrawals.     #substance induced mood disorder -Patient and wife state he was not depressed when he was sober for >2 months 10 years ago but  -cont Effexor  XR 75 mg qdaily off label  #GAD -cont Effexor  XR 75 mg qdaily starting tomorrow -cont trazodone  100 mg at bedtime for insomnia - cont hydroxyzine  25 mg TID-PRN for anxiety   ##alcohol use disorder with withdrawal Cont CIWA + PRN ATivan  - cont naltrexone  50 mg qdaily after discussing r/b/a, declines acamprosate or disulfiram  #nicotine  dependence -cont nicotine  patch 21 mg qdaily as patient state he  vapes daily  Sabel Hornbeck, MD 05/23/2024 11:49 AM

## 2024-05-23 NOTE — Discharge Planning (Signed)
 Per provider, patient will be stable for DC to home tomorrow and has SAIOP assessment scheduled for his follow up and instructions will be provided in patients AVS upon DC.

## 2024-05-23 NOTE — Group Note (Signed)
 Group Topic: Social Support  Group Date: 05/23/2024 Start Time: 1720 End Time: 1750 Facilitators: Celinda Suzen NOVAK, NT  Department: [facility]  Number of Participants: 10  Group Focus: relapse prevention Treatment Modality:  Psychoeducation Interventions utilized were support Purpose: increase insight and regain self-worth  Name: Danny Downs Date of Birth: 08/14/1976  MR: [id number]    Level of Participation: active Quality of Participation: cooperative Interactions with others: gave feedback Mood/Affect: appropriate Triggers (if applicable): n/a Cognition: coherent/clear Progress: Gaining insight Response: PT stated that he feels better than when he first came Plan: patient will be encouraged to attend group  Patients Problems:  Patient Active Problem List   Diagnosis Date Noted   Alcohol use disorder 05/21/2024

## 2024-05-23 NOTE — ED Notes (Signed)
Patient attending AA meeting. 

## 2024-05-23 NOTE — ED Notes (Signed)
 Patient asleep in bed at this time without issue or concern. Will monitor

## 2024-05-23 NOTE — Group Note (Signed)
 Group Topic: Relapse and Recovery  Group Date: 05/23/2024 Start Time: 1215 End Time: 1310 Facilitators: Stanly Stabile, RN  Department: [facility]  Number of Participants: 9  Group Focus: chemical dependency education, chemical dependency issues, clarity of thought, communication, coping skills, and dual diagnosis Treatment Modality:  Behavior Modification Therapy Interventions utilized were clarification, confrontation, exploration, group exercise, patient education, problem solving, reality testing, and story telling Purpose: enhance coping skills, explore maladaptive thinking, express feelings, express irrational fears, improve communication skills, increase insight, regain self-worth, reinforce self-care, relapse prevention strategies, and trigger / craving management  Name: Danny Downs Date of Birth: [birth year]  MR: [id number]    Level of Participation: active Quality of Participation: attentive and cooperative Interactions with others: gave feedback Mood/Affect: appropriate Triggers (if applicable):   Cognition: coherent/clear and goal directed Progress: Moderate Response:   Plan: follow-up needed  Patients Problems:  Patient Active Problem List   Diagnosis Date Noted   Alcohol use disorder 05/21/2024

## 2024-05-23 NOTE — ED Notes (Signed)
 Patient is attending social work group.  He has been calm and pleasant on unit.  No distress.  He is social with peers and appears motivated for change.  Will monitor.

## 2024-05-23 NOTE — ED Notes (Signed)
 Patient is sleeping. Respirations equal and unlabored. No change in assessment or acuity. Routine safety checks conducted according to facility protocol.

## 2024-05-23 NOTE — ED Notes (Signed)
 Pt is sleeping . No acute distress noted.

## 2024-05-24 ENCOUNTER — Telehealth: Payer: Self-pay | Admitting: Psychiatry

## 2024-05-24 ENCOUNTER — Other Ambulatory Visit: Payer: Self-pay | Admitting: Psychiatry

## 2024-05-24 DIAGNOSIS — F1021 Alcohol dependence, in remission: Secondary | ICD-10-CM

## 2024-05-24 DIAGNOSIS — F1994 Other psychoactive substance use, unspecified with psychoactive substance-induced mood disorder: Secondary | ICD-10-CM | POA: Diagnosis not present

## 2024-05-24 DIAGNOSIS — F10230 Alcohol dependence with withdrawal, uncomplicated: Secondary | ICD-10-CM

## 2024-05-24 DIAGNOSIS — F411 Generalized anxiety disorder: Secondary | ICD-10-CM | POA: Diagnosis not present

## 2024-05-24 LAB — CBC WITH DIFFERENTIAL/PLATELET
Abs Immature Granulocytes: 0.02 K/uL (ref 0.00–0.07)
Basophils Absolute: 0 K/uL (ref 0.0–0.1)
Basophils Relative: 1 %
Eosinophils Absolute: 0.2 K/uL (ref 0.0–0.5)
Eosinophils Relative: 2 %
HCT: 40.6 % (ref 39.0–52.0)
Hemoglobin: 14.1 g/dL (ref 13.0–17.0)
Immature Granulocytes: 0 %
Lymphocytes Relative: 31 %
Lymphs Abs: 2.5 K/uL (ref 0.7–4.0)
MCH: 30.4 pg (ref 26.0–34.0)
MCHC: 34.7 g/dL (ref 30.0–36.0)
MCV: 87.5 fL (ref 80.0–100.0)
Monocytes Absolute: 1.1 K/uL — ABNORMAL HIGH (ref 0.1–1.0)
Monocytes Relative: 13 %
Neutro Abs: 4.4 K/uL (ref 1.7–7.7)
Neutrophils Relative %: 53 %
Platelets: 252 K/uL (ref 150–400)
RBC: 4.64 MIL/uL (ref 4.22–5.81)
RDW: 12.3 % (ref 11.5–15.5)
WBC: 8.3 K/uL (ref 4.0–10.5)
nRBC: 0 % (ref 0.0–0.2)

## 2024-05-24 LAB — COMPREHENSIVE METABOLIC PANEL WITH GFR
ALT: 55 U/L — ABNORMAL HIGH (ref 0–44)
AST: 68 U/L — ABNORMAL HIGH (ref 15–41)
Albumin: 3.6 g/dL (ref 3.5–5.0)
Alkaline Phosphatase: 47 U/L (ref 38–126)
Anion gap: 12 (ref 5–15)
BUN: 9 mg/dL (ref 6–20)
CO2: 26 mmol/L (ref 22–32)
Calcium: 9.7 mg/dL (ref 8.9–10.3)
Chloride: 102 mmol/L (ref 98–111)
Creatinine, Ser: 0.7 mg/dL (ref 0.61–1.24)
GFR, Estimated: >60 mL/min (ref >60)
Glucose, Bld: 84 mg/dL (ref 70–99)
Potassium: 3.1 mmol/L — ABNORMAL LOW (ref 3.5–5.1)
Sodium: 140 mmol/L (ref 135–145)
Total Bilirubin: 0.9 mg/dL (ref 0.0–1.2)
Total Protein: 6.6 g/dL (ref 6.5–8.1)

## 2024-05-24 LAB — TSH: TSH: 0.904 u[IU]/mL (ref 0.350–4.500)

## 2024-05-24 LAB — T4, FREE: Free T4: 0.74 ng/dL (ref 0.61–1.12)

## 2024-05-24 MED ORDER — NICOTINE 21 MG/24HR TD PT24: 21.0000 mg | MEDICATED_PATCH | Freq: Every day | TRANSDERMAL | 0 refills | Status: AC

## 2024-05-24 MED ORDER — VENLAFAXINE HCL ER 75 MG PO CP24: 75.0000 mg | ORAL_CAPSULE | Freq: Every day | ORAL | 0 refills | Status: AC

## 2024-05-24 MED ORDER — ADULT MULTIVITAMIN W/MINERALS CH: 1.0000 | ORAL_TABLET | Freq: Every day | ORAL | Status: AC

## 2024-05-24 MED ORDER — TRAZODONE HCL 100 MG PO TABS: 100.0000 mg | ORAL_TABLET | Freq: Every evening | ORAL | 0 refills | Status: AC | PRN

## 2024-05-24 MED ORDER — POTASSIUM CHLORIDE CRYS ER 20 MEQ PO TBCR
40.0000 meq | EXTENDED_RELEASE_TABLET | Freq: Once | ORAL | Status: AC
  Administered 2024-05-24: 40 meq via ORAL
  Filled 2024-05-24: qty 2

## 2024-05-24 MED ORDER — NALTREXONE HCL 50 MG PO TABS: 50.0000 mg | ORAL_TABLET | Freq: Every day | ORAL | 0 refills | Status: DC

## 2024-05-24 MED ORDER — NALTREXONE HCL 50 MG PO TABS: 50.0000 mg | ORAL_TABLET | Freq: Every day | ORAL | 0 refills | Status: AC

## 2024-05-24 MED ORDER — HYDROXYZINE HCL 25 MG PO TABS
25.0000 mg | ORAL_TABLET | Freq: Three times a day (TID) | ORAL | 0 refills | Status: AC | PRN
Start: 2024-05-24 — End: ?
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
## 2024-05-24 NOTE — ED Notes (Signed)
 Pt is in the dayroom watching TV with peers. Pt denies SI/HI/AVH. Pt has no further complain.No acute distress noted.

## 2024-05-24 NOTE — ED Notes (Signed)
 Patient remains asleep in bed without issue or concern.  Patient is scheduled for discharge later today.  Will monitor.

## 2024-05-24 NOTE — ED Notes (Signed)
Pt is sleeping, no acute distress noted.

## 2024-05-24 NOTE — Progress Notes (Signed)
 Patient was called that one of his paper scripts just came out of printer.  - patient would like to have Naltrexone  50 mg qdaily sent to pharmacy on file #30 sent

## 2024-05-24 NOTE — Group Note (Signed)
 Group Topic: Positive Affirmations  Group Date: 05/23/2024 Start Time: 2000 End Time: 2110 Facilitators: Lenon Lenice SAUNDERS, NT  Department: [facility]  Number of Participants: 7  Group Focus: affirmation Treatment Modality:  Individual Therapy Interventions utilized were support Purpose: express feelings  Name: Danny Downs Date of Birth: 03-Jul-1976  MR: [id number]    Level of Participation: active Quality of Participation: engaged Interactions with others: gave feedback Mood/Affect: positive Triggers (if applicable):  Cognition: coherent/clear Progress: Moderate Response:  Plan: follow-up needed  Patients Problems:  Patient Active Problem List   Diagnosis Date Noted   Alcohol use disorder 05/21/2024

## 2024-05-24 NOTE — Addendum Note (Signed)
 Addended by: Lyrick Worland on: 05/24/2024 06:54 PM   Modules accepted: Orders

## 2024-05-30 ENCOUNTER — Ambulatory Visit (HOSPITAL_COMMUNITY)

## 2024-05-31 ENCOUNTER — Telehealth (HOSPITAL_COMMUNITY): Payer: Self-pay

## 2024-05-31 ENCOUNTER — Encounter (HOSPITAL_COMMUNITY): Payer: Self-pay

## 2024-05-31 NOTE — Telephone Encounter (Signed)
 Therapist calls this pt as he was a no Show for his CCA for CD-IOP.  Therapist reaches a message that says his voice mail is full and cannot accept messages.  Because his MyChart is pending, this therapist will send him a letter to the address on record.  Darice Simpler, MS, LMFT, LCAS

## 2024-07-08 NOTE — Telephone Encounter (Signed)
 Incorrect encounter, unable to delete, please delete
